# Patient Record
Sex: Female | Born: 1985 | Race: Black or African American | Hispanic: No | Marital: Married | Smoking: Never smoker
Health system: Southern US, Community
[De-identification: ages and names within clinical notes are randomized; demographics above are authoritative.]

## PROBLEM LIST (undated history)

## (undated) DIAGNOSIS — K8020 Calculus of gallbladder without cholecystitis without obstruction: Secondary | ICD-10-CM

## (undated) DIAGNOSIS — K859 Acute pancreatitis without necrosis or infection, unspecified: Secondary | ICD-10-CM

## (undated) DIAGNOSIS — K811 Chronic cholecystitis: Secondary | ICD-10-CM

## (undated) DIAGNOSIS — R748 Abnormal levels of other serum enzymes: Secondary | ICD-10-CM

## (undated) DIAGNOSIS — N764 Abscess of vulva: Secondary | ICD-10-CM

## (undated) DIAGNOSIS — K4090 Unilateral inguinal hernia, without obstruction or gangrene, not specified as recurrent: Secondary | ICD-10-CM

## (undated) DIAGNOSIS — A539 Syphilis, unspecified: Secondary | ICD-10-CM

## (undated) HISTORY — DX: Syphilis, unspecified: A53.9

---
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
## 2004-04-22 ENCOUNTER — Emergency Department (HOSPITAL_COMMUNITY): Admission: EM | Admit: 2004-04-22 | Discharge: 2004-04-22 | Payer: Self-pay

## 2006-12-24 ENCOUNTER — Emergency Department (HOSPITAL_COMMUNITY): Admission: EM | Admit: 2006-12-24 | Discharge: 2006-12-24 | Payer: Self-pay | Admitting: *Deleted

## 2007-07-31 ENCOUNTER — Emergency Department (HOSPITAL_COMMUNITY): Admission: EM | Admit: 2007-07-31 | Discharge: 2007-07-31 | Payer: Self-pay | Admitting: Family Medicine

## 2007-08-02 ENCOUNTER — Emergency Department (HOSPITAL_COMMUNITY): Admission: EM | Admit: 2007-08-02 | Discharge: 2007-08-02 | Payer: Self-pay | Admitting: Family Medicine

## 2007-12-20 ENCOUNTER — Emergency Department (HOSPITAL_COMMUNITY): Admission: EM | Admit: 2007-12-20 | Discharge: 2007-12-20 | Payer: Self-pay | Admitting: Family Medicine

## 2008-03-08 ENCOUNTER — Ambulatory Visit (HOSPITAL_COMMUNITY): Admission: RE | Admit: 2008-03-08 | Discharge: 2008-03-08 | Payer: Self-pay | Admitting: Obstetrics & Gynecology

## 2008-05-03 ENCOUNTER — Ambulatory Visit (HOSPITAL_COMMUNITY): Admission: RE | Admit: 2008-05-03 | Discharge: 2008-05-03 | Payer: Self-pay | Admitting: Family Medicine

## 2008-05-04 ENCOUNTER — Inpatient Hospital Stay (HOSPITAL_COMMUNITY): Admission: AD | Admit: 2008-05-04 | Discharge: 2008-05-04 | Payer: Self-pay | Admitting: Gynecology

## 2008-05-06 ENCOUNTER — Ambulatory Visit: Payer: Self-pay | Admitting: Advanced Practice Midwife

## 2008-05-06 ENCOUNTER — Inpatient Hospital Stay (HOSPITAL_COMMUNITY): Admission: AD | Admit: 2008-05-06 | Discharge: 2008-05-06 | Payer: Self-pay | Admitting: Obstetrics & Gynecology

## 2008-05-09 ENCOUNTER — Ambulatory Visit: Payer: Self-pay | Admitting: Family

## 2008-05-09 ENCOUNTER — Inpatient Hospital Stay (HOSPITAL_COMMUNITY): Admission: AD | Admit: 2008-05-09 | Discharge: 2008-05-14 | Payer: Self-pay | Admitting: Family Medicine

## 2008-05-12 ENCOUNTER — Encounter: Payer: Self-pay | Admitting: Physician Assistant

## 2009-03-15 ENCOUNTER — Emergency Department (HOSPITAL_COMMUNITY): Admission: EM | Admit: 2009-03-15 | Discharge: 2009-03-15 | Payer: Self-pay | Admitting: Family Medicine

## 2009-08-18 IMAGING — US US OB FOLLOW-UP
1 series · 14 of 28 positions shown · non-contrast
Comparison: none

OBSTETRICAL ULTRASOUND:
 This ultrasound exam was performed in the [HOSPITAL] Ultrasound Department.  The OB US report was generated in the AS system, and faxed to the ordering physician.  This report is also available in [REDACTED] PACS.

[Series 1: us ob follow-up · 0.24mm/px · 14 of 29 slices shown]
[im 2/29]
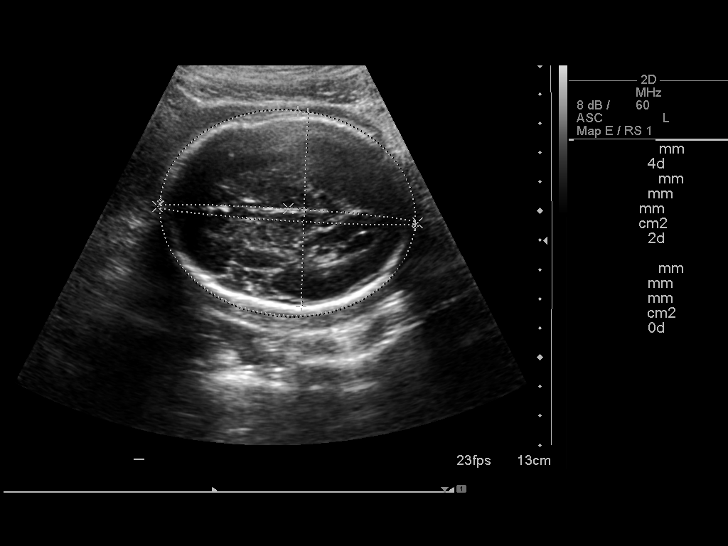
[im 4/29]
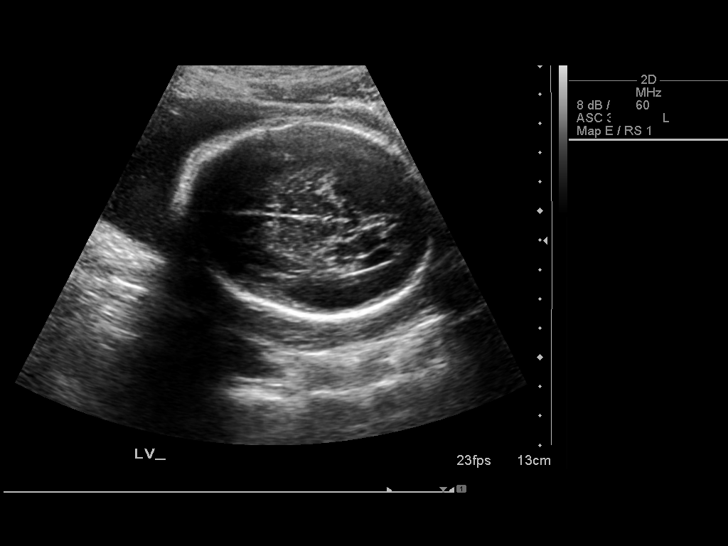
[im 6/29]
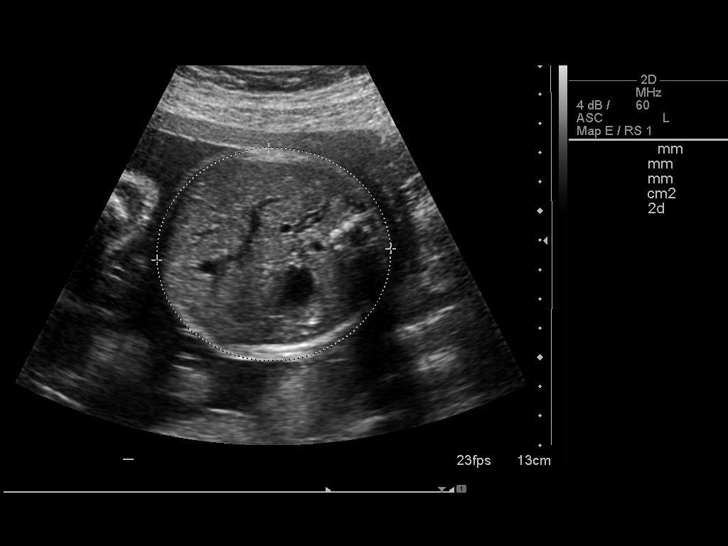
[im 8/29]
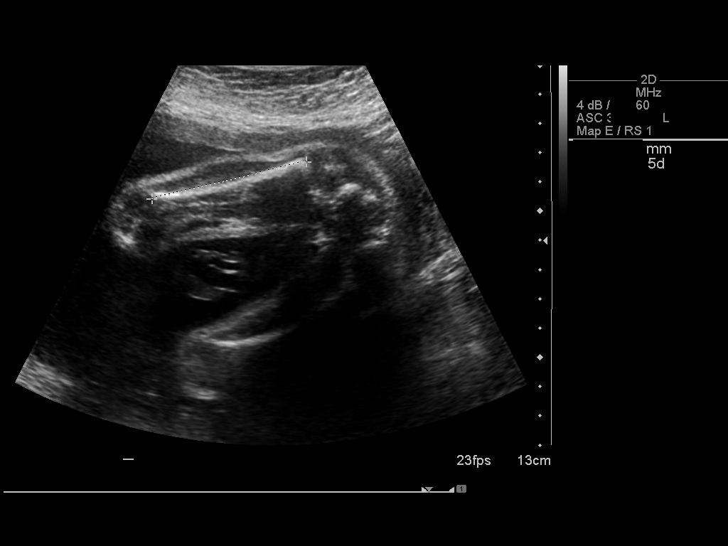
[im 10/29]
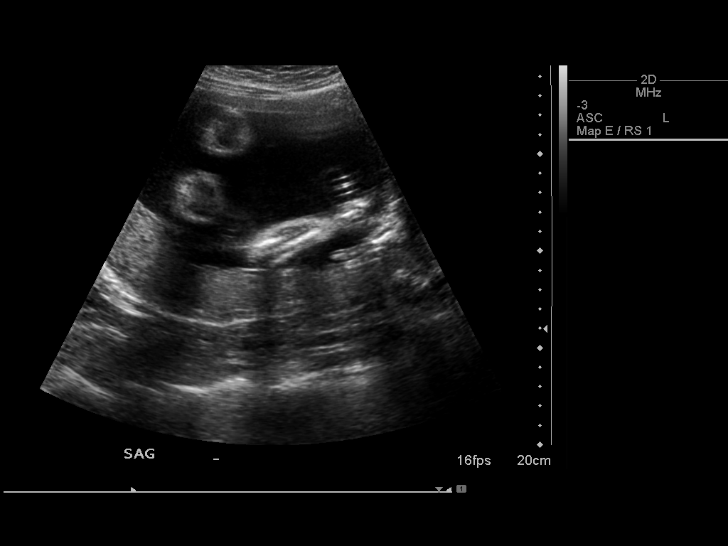
[im 12/29]
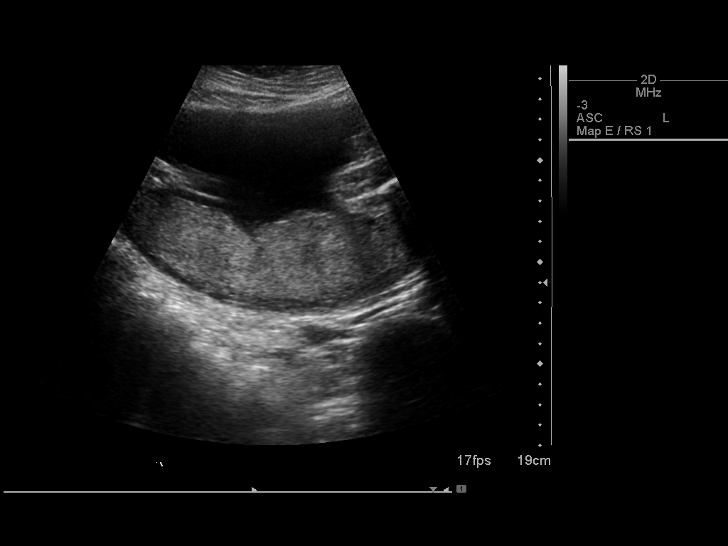
[im 14/29]
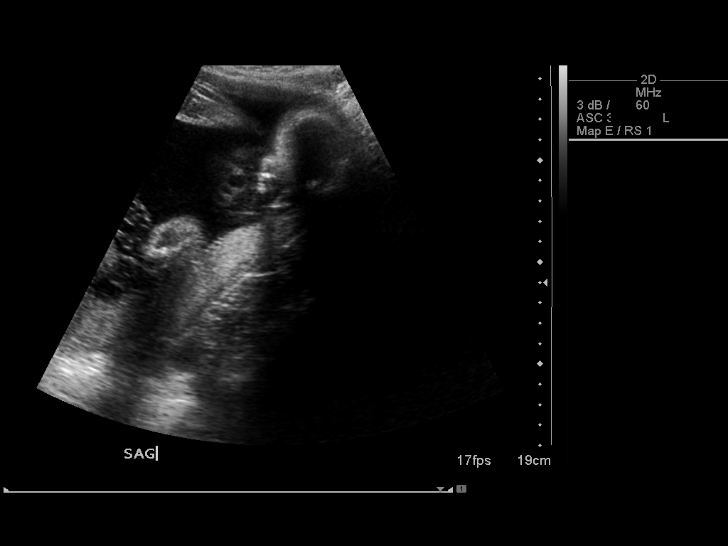
[im 16/29]
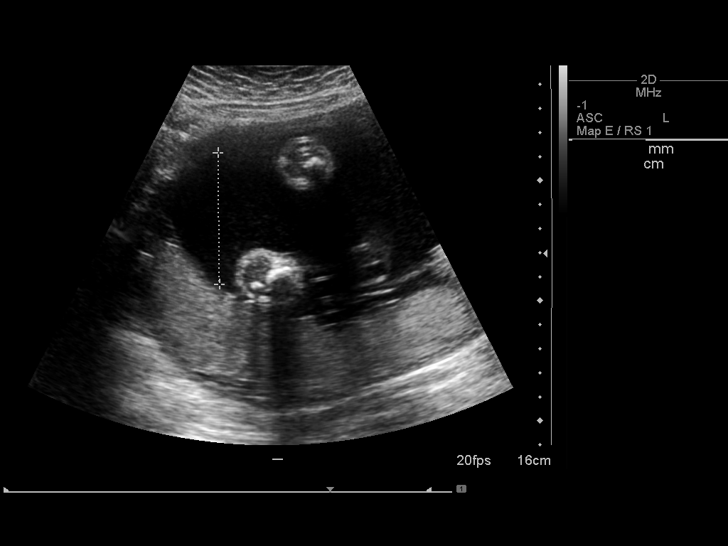
[im 18/29]
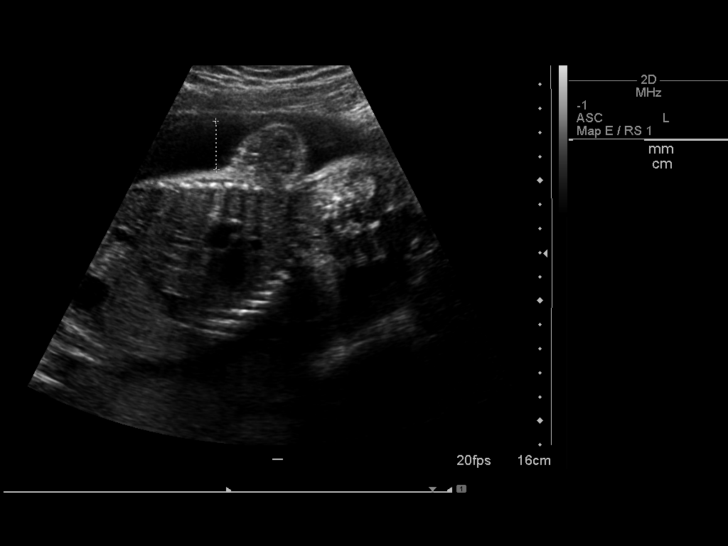
[im 20/29]
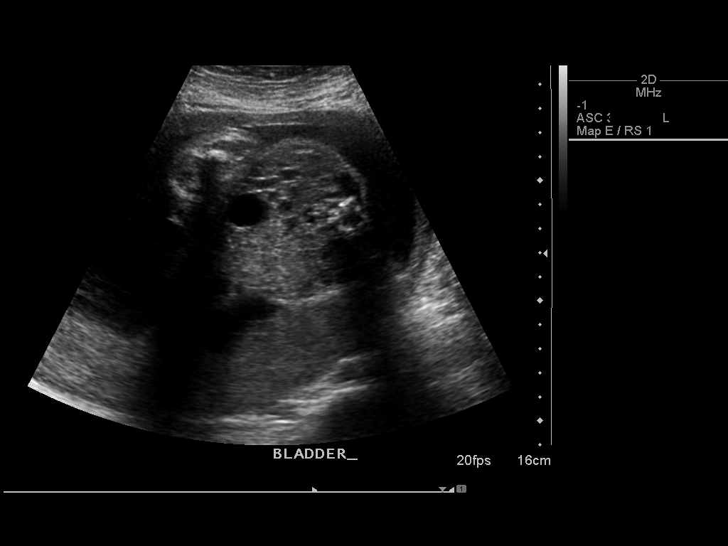
[im 22/29]
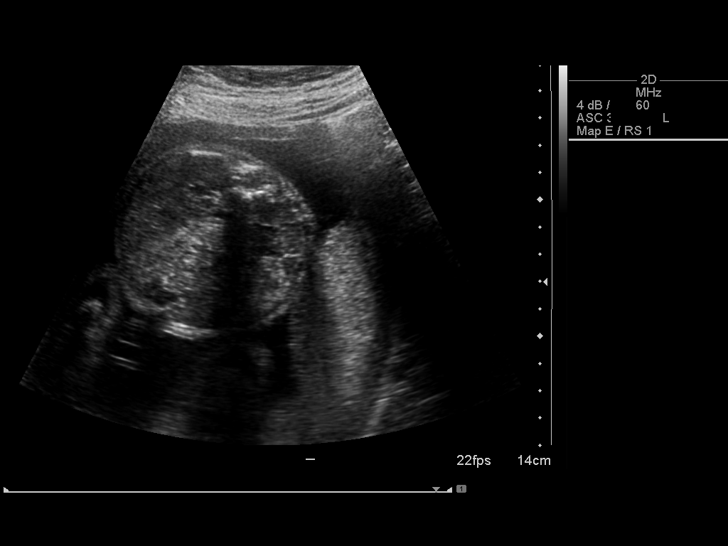
[im 24/29]
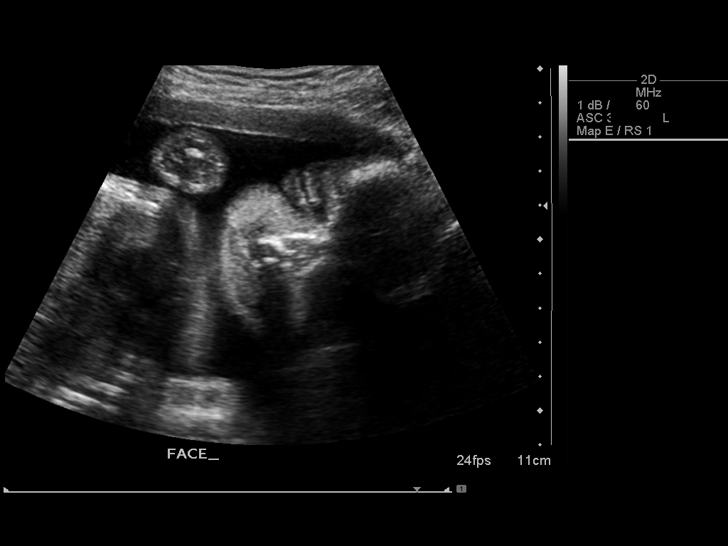
[im 26/29]
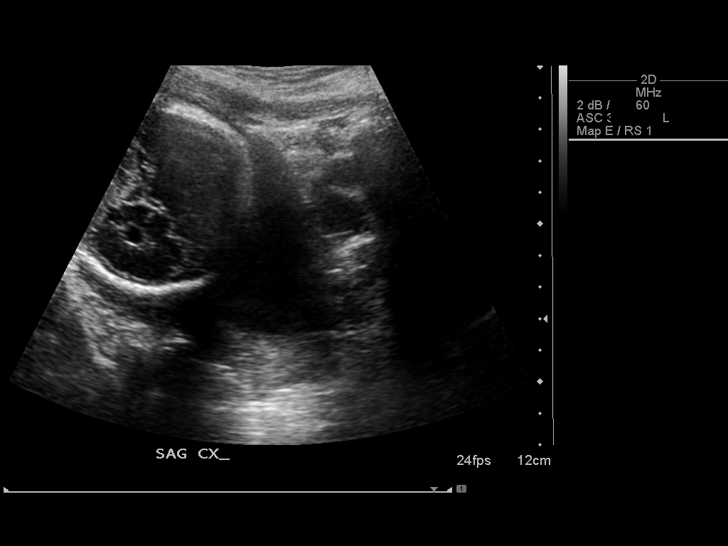
[im 29/29]
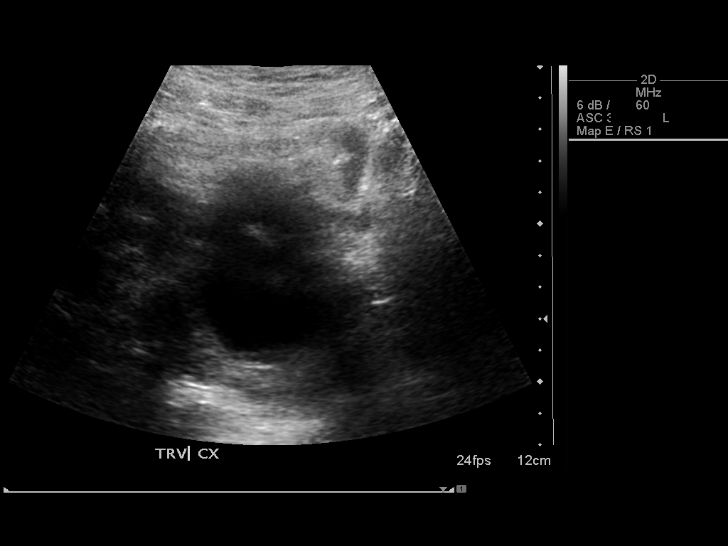

[14 of 28 positions shown; findings below may reference images not displayed]

IMPRESSION: See AS Obstetric US report.

## 2011-02-20 LAB — POCT URINALYSIS DIP (DEVICE)
Glucose, UA: NEGATIVE mg/dL
Ketones, ur: 40 mg/dL — AB
Nitrite: NEGATIVE
Specific Gravity, Urine: 1.025 (ref 1.005–1.030)
pH: 8 (ref 5.0–8.0)

## 2011-02-20 LAB — POCT PREGNANCY, URINE: Preg Test, Ur: NEGATIVE

## 2011-06-07 ENCOUNTER — Inpatient Hospital Stay (INDEPENDENT_AMBULATORY_CARE_PROVIDER_SITE_OTHER)
Admission: RE | Admit: 2011-06-07 | Discharge: 2011-06-07 | Disposition: A | Discharge status: Home / Self Care | Payer: Self-pay | Source: Ambulatory Visit | Attending: Family Medicine | Admitting: Family Medicine

## 2011-06-07 DIAGNOSIS — M62838 Other muscle spasm: Secondary | ICD-10-CM

## 2011-06-07 DIAGNOSIS — M549 Dorsalgia, unspecified: Secondary | ICD-10-CM

## 2011-08-09 LAB — GC/CHLAMYDIA PROBE AMP, GENITAL
Chlamydia, DNA Probe: NEGATIVE
GC Probe Amp, Genital: NEGATIVE

## 2011-08-09 LAB — URINALYSIS, ROUTINE W REFLEX MICROSCOPIC
Glucose, UA: NEGATIVE
Ketones, ur: >80 — AB
Nitrite: NEGATIVE
Protein, ur: NEGATIVE
pH: 6

## 2011-08-09 LAB — CBC
HCT: 38.7
HCT: 36
MCHC: 34.4
MCHC: 34.3
MCV: 94.9
Platelets: 192
RBC: 4.07
RBC: 3.82 — ABNORMAL LOW
RDW: 13.2
RDW: 13.1
WBC: 9.5

## 2011-08-09 LAB — WET PREP, GENITAL
Clue Cells Wet Prep HPF POC: NONE SEEN
Clue Cells Wet Prep HPF POC: NONE SEEN
Trich, Wet Prep: NONE SEEN

## 2011-08-09 LAB — URINE MICROSCOPIC-ADD ON

## 2011-12-03 ENCOUNTER — Emergency Department (INDEPENDENT_AMBULATORY_CARE_PROVIDER_SITE_OTHER)
Admission: EM | Admit: 2011-12-03 | Discharge: 2011-12-03 | Disposition: A | Discharge status: Home / Self Care | Payer: Self-pay | Source: Home / Self Care | Attending: Family Medicine | Admitting: Family Medicine

## 2011-12-03 ENCOUNTER — Encounter (HOSPITAL_COMMUNITY): Payer: Self-pay

## 2011-12-03 DIAGNOSIS — J020 Streptococcal pharyngitis: Secondary | ICD-10-CM

## 2011-12-03 LAB — POCT RAPID STREP A: Streptococcus, Group A Screen (Direct): NEGATIVE

## 2011-12-03 MED ORDER — AMOXICILLIN 500 MG PO CAPS: 500.0000 mg | ORAL_CAPSULE | Freq: Three times a day (TID) | ORAL | Status: AC

## 2011-12-03 NOTE — ED Provider Notes (Signed)
History     CSN: [id number]  Arrival date & time 12/03/11  1731   First MD Initiated Contact with Patient 12/03/11 1747      Chief Complaint  Patient presents with  . Sore Throat    (Consider location/radiation/quality/duration/timing/severity/associated sxs/prior treatment) Patient is a 26 y.o. female presenting with pharyngitis. The history is provided by the patient.  Sore Throat This is a new problem. The current episode started more than 2 days ago. The problem occurs constantly. The problem has been gradually worsening. Pertinent negatives include no chest pain and no abdominal pain. The symptoms are aggravated by swallowing.    History reviewed. No pertinent past medical history.  History reviewed. No pertinent past surgical history.  History reviewed. No pertinent family history.  History  Substance Use Topics  . Smoking status: Not on file  . Smokeless tobacco: Not on file  . Alcohol Use: Not on file    OB History    Grav Para Term Preterm Abortions TAB SAB Ect Mult Living                  Review of Systems  Constitutional: Negative.   HENT: Positive for sore throat. Negative for congestion, rhinorrhea and postnasal drip.   Respiratory: Negative for cough.   Cardiovascular: Negative for chest pain.  Gastrointestinal: Negative.  Negative for abdominal pain.  Skin: Negative for rash.    Allergies  Review of patient's allergies indicates no known allergies.  Home Medications   Current Outpatient Rx  Name Route Sig Dispense Refill  . AMOXICILLIN 500 MG PO CAPS Oral Take 1 capsule (500 mg total) by mouth 3 (three) times daily. 30 capsule 0    BP 134/84  Pulse 75  Temp(Src) 99.6 F (37.6 C) (Oral)  SpO2 99%  Physical Exam  Nursing note and vitals reviewed. Constitutional: She appears well-developed and well-nourished.  HENT:  Head: Normocephalic.  Right Ear: External ear normal.  Left Ear: External ear normal.  Mouth/Throat: Oropharyngeal  exudate and posterior oropharyngeal erythema present.    ED Course  Procedures (including critical care time)   Labs Reviewed  POCT RAPID STREP A ([facility] ONLY)   No results found.   1. Strep throat       MDM  Strep --neg.        Barkley Bruns, MD 12/03/11 [phone]

## 2011-12-03 NOTE — ED Notes (Signed)
C/o 3 day duration of ST; NAD, minimal relief w home treatment

## 2013-04-05 ENCOUNTER — Emergency Department (HOSPITAL_COMMUNITY): Payer: BC Managed Care – PPO

## 2013-04-05 ENCOUNTER — Emergency Department (INDEPENDENT_AMBULATORY_CARE_PROVIDER_SITE_OTHER)
Admission: EM | Admit: 2013-04-05 | Discharge: 2013-04-05 | Disposition: A | Discharge status: Nursing Home (Includes ICF) | Payer: BC Managed Care – PPO | Source: Home / Self Care | Attending: Family Medicine | Admitting: Family Medicine

## 2013-04-05 ENCOUNTER — Emergency Department (HOSPITAL_COMMUNITY)
Admission: EM | Admit: 2013-04-05 | Discharge: 2013-04-05 | Disposition: A | Discharge status: Home / Self Care | Payer: BC Managed Care – PPO | Attending: Emergency Medicine | Admitting: Emergency Medicine

## 2013-04-05 ENCOUNTER — Encounter (HOSPITAL_COMMUNITY): Payer: Self-pay | Admitting: Emergency Medicine

## 2013-04-05 ENCOUNTER — Encounter (HOSPITAL_COMMUNITY): Payer: Self-pay | Admitting: Nurse Practitioner

## 2013-04-05 DIAGNOSIS — R748 Abnormal levels of other serum enzymes: Secondary | ICD-10-CM | POA: Insufficient documentation

## 2013-04-05 DIAGNOSIS — K8020 Calculus of gallbladder without cholecystitis without obstruction: Secondary | ICD-10-CM

## 2013-04-05 DIAGNOSIS — K859 Acute pancreatitis without necrosis or infection, unspecified: Secondary | ICD-10-CM | POA: Insufficient documentation

## 2013-04-05 DIAGNOSIS — R1011 Right upper quadrant pain: Secondary | ICD-10-CM

## 2013-04-05 DIAGNOSIS — K851 Biliary acute pancreatitis without necrosis or infection: Secondary | ICD-10-CM

## 2013-04-05 DIAGNOSIS — K8010 Calculus of gallbladder with chronic cholecystitis without obstruction: Secondary | ICD-10-CM | POA: Insufficient documentation

## 2013-04-05 LAB — COMPREHENSIVE METABOLIC PANEL
BUN: 6 mg/dL (ref 6–23)
Calcium: 9.4 mg/dL (ref 8.4–10.5)
GFR calc Af Amer: >90 mL/min (ref >90)
Glucose, Bld: 87 mg/dL (ref 70–99)
Sodium: 139 mEq/L (ref 135–145)
Total Protein: 7.7 g/dL (ref 6.0–8.3)

## 2013-04-05 LAB — CBC WITH DIFFERENTIAL/PLATELET
Basophils Relative: 0 % (ref 0–1)
Eosinophils Absolute: 0 10*3/uL (ref 0.0–0.7)
Eosinophils Relative: 0 % (ref 0–5)
Lymphs Abs: 1.7 10*3/uL (ref 0.7–4.0)
MCH: 30.8 pg (ref 26.0–34.0)
MCHC: 34.3 g/dL (ref 30.0–36.0)
MCV: 89.9 fL (ref 78.0–100.0)
Monocytes Relative: 6 % (ref 3–12)
Platelets: 216 10*3/uL (ref 150–400)
RBC: 4.74 MIL/uL (ref 3.87–5.11)

## 2013-04-05 LAB — POCT URINALYSIS DIP (DEVICE)
Glucose, UA: NEGATIVE mg/dL
Leukocytes, UA: NEGATIVE
Urobilinogen, UA: 4 mg/dL — ABNORMAL HIGH (ref 0.0–1.0)
pH: 5.5 (ref 5.0–8.0)

## 2013-04-05 LAB — POCT PREGNANCY, URINE: Preg Test, Ur: NEGATIVE

## 2013-04-05 LAB — LIPASE, BLOOD: Lipase: 214 U/L — ABNORMAL HIGH (ref 11–59)

## 2013-04-05 MED ORDER — ONDANSETRON 4 MG PO TBDP: 4.0000 mg | ORAL_TABLET | Freq: Three times a day (TID) | ORAL | Status: DC | PRN

## 2013-04-05 MED ORDER — OXYCODONE HCL 5 MG PO TABS: 5.0000 mg | ORAL_TABLET | ORAL | Status: DC | PRN

## 2013-04-05 MED ORDER — ONDANSETRON 4 MG PO TBDP
4.0000 mg | ORAL_TABLET | Freq: Once | ORAL | Status: AC
  Administered 2013-04-05: 4 mg via ORAL
  Filled 2013-04-05: qty 1

## 2013-04-05 NOTE — ED Provider Notes (Signed)
History     CSN: [id number]  Arrival date & time 04/05/13  1303   First MD Initiated Contact with Patient 04/05/13 1323      Chief Complaint  Patient presents with  . Abdominal Pain    (Consider location/radiation/quality/duration/timing/severity/associated sxs/prior treatment) HPI Comments: Pt presents to the ED from UC for further evaluation of RUQ/epigastric abdominal pain.  Pt reports Friday evening after eating spaghetti she began to fill extremely nauseous with sharp stabbing abdominal pains. She made herself throw up, after which she felt fine.  Patient reports same thing events occurred Saturday evening after eating baked chicken. Friend who is with her today ate the same foods both nights and did not have any of the same sx.  Pt is currently asx- notes sx only seem to happen late at night after eating dinner, never during the day. Was seen by urgent care provider earlier today, sent here for evaluation of possible gallstones.  Denies any recent intake of fatty greasy foods. No hematemesis, diarrhea, hematochezia, dysuria, hematuria, or increased urinary frequency.  Pt currently has mirena IUD.  No recent EtOH.  No chest pain or SOB.  The history is provided by the patient.    History reviewed. No pertinent past medical history.  History reviewed. No pertinent past surgical history.  History reviewed. No pertinent family history.  History  Substance Use Topics  . Smoking status: Never Smoker   . Smokeless tobacco: Not on file  . Alcohol Use: No    OB History   Grav Para Term Preterm Abortions TAB SAB Ect Mult Living                  Review of Systems  Gastrointestinal: Positive for nausea, vomiting and abdominal pain.  All other systems reviewed and are negative.    Allergies  Review of patient's allergies indicates no known allergies.  Home Medications   Current Outpatient Rx  Name  Route  Sig  Dispense  Refill  . acetaminophen (TYLENOL) 500 MG tablet  Oral   Take 1,000 mg by mouth daily as needed for pain. For pain           BP 136/78  Pulse 63  Temp(Src) 98.8 F (37.1 C) (Oral)  Resp 16  SpO2 99%  LMP 03/14/2013  Physical Exam  Nursing note and vitals reviewed. Constitutional: She is oriented to person, place, and time. She appears well-developed and well-nourished.  HENT:  Head: Normocephalic and atraumatic.  Mouth/Throat: Oropharynx is clear and moist.  Eyes: Conjunctivae and EOM are normal. Pupils are equal, round, and reactive to light.  Neck: Normal range of motion.  Cardiovascular: Normal rate, regular rhythm and normal heart sounds.   Pulmonary/Chest: Effort normal and breath sounds normal.  Abdominal: Soft. Bowel sounds are normal. There is no tenderness. There is no CVA tenderness, no tenderness at McBurney's point and negative Murphy's sign.  No TTP of RUQ or epigastrium  Musculoskeletal: Normal range of motion.  Neurological: She is alert and oriented to person, place, and time.  Skin: Skin is warm and dry.  Psychiatric: She has a normal mood and affect.    ED Course  Procedures (including critical care time)  Labs Reviewed  COMPREHENSIVE METABOLIC PANEL - Abnormal; Notable for the following:    AST 526 (*)    ALT 672 (*)    Alkaline Phosphatase 195 (*)    Total Bilirubin 2.0 (*)    All other components within normal limits  LIPASE, BLOOD -  Abnormal; Notable for the following:    Lipase 214 (*)    All other components within normal limits  CBC WITH DIFFERENTIAL   US Abdomen Complete  04/05/2013   *RADIOLOGY REPORT*  Clinical Data:  Right upper quadrant pain.  Nausea, vomiting.  COMPLETE ABDOMINAL ULTRASOUND  Comparison:  No previous abdominal exams  Findings:  Gallbladder:  The gallbladder, there are numerous stones, creating significant shadowing.  Stones are small difficult to measure discretely.  Wall thickness is normal, 2.2 mm.  No sonographic Murphy's sign.  Common bile duct:  5.6 mm.  Liver:  No  focal lesion identified.  Within normal limits in parenchymal echogenicity.  IVC:  Appears normal.  Pancreas:  No focal abnormality seen.  Spleen:  Normal in appearance, 4.1 cm.  Right Kidney:  Normal in appearance, 10.5 cm.  Left Kidney:  Normal in appearance, 10.1 cm.  Abdominal aorta:  Not aneurysmal, 1.7 cm.  IMPRESSION:  1.  Multiple gallstones without other evidence for acute cholecystitis. 2.  No other evidence for acute abnormality in abdomen.   Original Report Authenticated By: Norva Pavlov, M.D.     1. Gallstones   2. Elevated liver enzymes   3. Gallstone pancreatitis       MDM   27 year old female presenting to the ED from urgent care for further evaluation of right upper quadrant back/epigastric pain. Episodes of waxing and waning over the weekend, associated with nausea and vomiting.   Pt remained asx while in the ED.  Labs abnormal, AST/ALT markedly elevated 526/672, alk phos 195, lipase 214- consistent with gallstone pancreatitis. Patient's case was discussed with general surgery, Dr. Derrell Lolling- OP FU vs IP observation and monitoring of LFTs. Pt will likely will need a cholecystectomy, but this is not urgent. Discussed with patient, she elected for outpatient followup- contact information given.  Rx Percocet and Zofran.  Return precautions advised.       Garlon Hatchet, PA-C 04/05/13 [phone]

## 2013-04-05 NOTE — ED Notes (Signed)
Pt c/o abdominal pain that started on Friday night. Acute onset. "stabbing sensation" pt states that after vomiting felt better.  Saturday no pain. Last night pain started at 10 pm and lasted until 2 a.m, pt states that she made her self vomit but did not relieve symptoms. Symptoms gradually eased off .  States having regular BM. Pt has not tried any otc meds.

## 2013-04-05 NOTE — ED Notes (Signed)
Sent from [facility] for further evaluation of abd pain on Friday and Saturday nights with some vomiting. Denies complaints now. A&Ox4, resp e/u

## 2013-04-05 NOTE — ED Notes (Signed)
UA and Upreg complete at [facility]

## 2013-04-05 NOTE — ED Provider Notes (Signed)
History     CSN: [id number]  Arrival date & time 04/05/13  1108   First MD Initiated Contact with Patient 04/05/13 1227      Chief Complaint  Patient presents with  . Abdominal Pain    abdominal pain that started friday night. "stabbing sensation"      Patient is a 27 y.o. female presenting with abdominal pain. The history is provided by the patient.  Abdominal Pain This is a recurrent problem. The current episode started more than 2 days ago. The problem has been resolved. Associated symptoms include abdominal pain. The symptoms are aggravated by eating. Nothing relieves the symptoms. She has tried nothing for the symptoms.  Pt reports onset of abd pain Friday night that is associated with N/V. Denies fever. States she seems to do ok during the day but 2 to 3 hrs after the evening meal she starts to have severe abd pain and pressure that hurts "all over". She then get nauseated and has vomiting. Reports 3 episodes of vomiting Friday night and 2 episodes last night. After several hours the pain and vomiting seem to resolve. Pt reports she had a similar episode around Christmas that resolved spontaneously. Reports LMP 03/14/13 that was normal. Currently using the [facility] IUD. Denies diarrhea, constipation or UTI sx's.   History reviewed. No pertinent past medical history.  History reviewed. No pertinent past surgical history.  History reviewed. No pertinent family history.  History  Substance Use Topics  . Smoking status: Never Smoker   . Smokeless tobacco: Not on file  . Alcohol Use: No    OB History   Grav Para Term Preterm Abortions TAB SAB Ect Mult Living                  Review of Systems  Constitutional: Negative for fever, chills and appetite change.  HENT: Negative.   Eyes: Negative.   Respiratory: Negative.   Cardiovascular: Negative.   Gastrointestinal: Positive for nausea, vomiting and abdominal pain. Negative for diarrhea, constipation and abdominal distention.   Endocrine: Negative.   Genitourinary: Negative.   Musculoskeletal: Negative.   Skin: Negative.   Allergic/Immunologic: Negative.   Neurological: Negative.   Hematological: Negative.   Psychiatric/Behavioral: Negative.     Allergies  Review of patient's allergies indicates no known allergies.  Home Medications  No current outpatient prescriptions on file.  BP 169/82  Pulse 86  Temp(Src) 98.4 F (36.9 C) (Oral)  Resp 18  SpO2 98%  LMP 03/14/2013  Physical Exam  Constitutional: She is oriented to person, place, and time. She appears well-developed and well-nourished.  HENT:  Head: Normocephalic and atraumatic.  Eyes: Conjunctivae are normal.  Cardiovascular: Normal rate and regular rhythm.   Pulmonary/Chest: Effort normal and breath sounds normal.  Abdominal: Soft. Bowel sounds are normal. She exhibits no distension. There is tenderness in the right upper quadrant. There is positive Murphy's sign. There is no rebound.  Musculoskeletal: Normal range of motion.  Neurological: She is alert and oriented to person, place, and time.  Skin: Skin is warm and dry.  Psychiatric: She has a normal mood and affect.    ED Course  Procedures (including critical care time)  Labs Reviewed  POCT URINALYSIS DIP (DEVICE) - Abnormal; Notable for the following:    Bilirubin Urine LARGE (*)    Ketones, ur 15 (*)    Hgb urine dipstick SMALL (*)    Urobilinogen, UA 4.0 (*)    All other components within normal limits  POCT  PREGNANCY, URINE   No results found.   No diagnosis found.    MDM  HPI and PE c/w biliary colic (probable gallstones). + Murphy's sign. Recurring episodes of severe abd pain, N/V. No fever. Urine shows mild dehydration and large bilirubin. Will send to [facility] ED for further work-up to r/o gallstones.        Leanne Chang, NP 04/05/13 1251

## 2013-04-06 NOTE — ED Provider Notes (Signed)
Medical screening examination/treatment/procedure(s) were performed by non-physician practitioner and as supervising physician I was immediately available for consultation/collaboration.   MORENO-COLL,Raghad Lorenz; MD  Markesha Hannig Moreno-Coll, MD 04/06/13 1005 

## 2013-04-09 NOTE — ED Provider Notes (Signed)
Medical screening examination/treatment/procedure(s) were performed by non-physician practitioner and as supervising physician I was immediately available for consultation/collaboration.   Nelia Shi, MD 04/09/13 [phone]

## 2017-06-20 ENCOUNTER — Encounter (HOSPITAL_COMMUNITY): Payer: Self-pay | Admitting: Emergency Medicine

## 2017-06-20 DIAGNOSIS — K8020 Calculus of gallbladder without cholecystitis without obstruction: Secondary | ICD-10-CM | POA: Diagnosis not present

## 2017-06-20 DIAGNOSIS — E876 Hypokalemia: Secondary | ICD-10-CM | POA: Diagnosis not present

## 2017-06-20 DIAGNOSIS — R1011 Right upper quadrant pain: Secondary | ICD-10-CM | POA: Insufficient documentation

## 2017-06-20 DIAGNOSIS — R1110 Vomiting, unspecified: Secondary | ICD-10-CM | POA: Diagnosis present

## 2017-06-20 LAB — CBC
HEMATOCRIT: 44.2 % (ref 36.0–46.0)
HEMOGLOBIN: 15.4 g/dL — AB (ref 12.0–15.0)
MCH: 32 pg (ref 26.0–34.0)
MCHC: 34.8 g/dL (ref 30.0–36.0)
MCV: 91.7 fL (ref 78.0–100.0)
Platelets: 244 10*3/uL (ref 150–400)
RBC: 4.82 MIL/uL (ref 3.87–5.11)
RDW: 12.5 % (ref 11.5–15.5)
WBC: 6.8 10*3/uL (ref 4.0–10.5)

## 2017-06-20 LAB — URINALYSIS, ROUTINE W REFLEX MICROSCOPIC
BILIRUBIN URINE: NEGATIVE
Glucose, UA: NEGATIVE mg/dL
KETONES UR: NEGATIVE mg/dL
Leukocytes, UA: NEGATIVE
NITRITE: NEGATIVE
Protein, ur: NEGATIVE mg/dL
Specific Gravity, Urine: 1.012 (ref 1.005–1.030)
pH: 6 (ref 5.0–8.0)

## 2017-06-20 LAB — COMPREHENSIVE METABOLIC PANEL
ALBUMIN: 4.2 g/dL (ref 3.5–5.0)
ALT: 47 U/L (ref 14–54)
AST: 80 U/L — ABNORMAL HIGH (ref 15–41)
Alkaline Phosphatase: 49 U/L (ref 38–126)
Anion gap: 10 (ref 5–15)
BUN: 12 mg/dL (ref 6–20)
CALCIUM: 9.1 mg/dL (ref 8.9–10.3)
CO2: 26 mmol/L (ref 22–32)
CREATININE: 0.98 mg/dL (ref 0.44–1.00)
Chloride: 100 mmol/L — ABNORMAL LOW (ref 101–111)
GFR calc non Af Amer: >60 mL/min (ref >60)
Glucose, Bld: 113 mg/dL — ABNORMAL HIGH (ref 65–99)
Potassium: 3.1 mmol/L — ABNORMAL LOW (ref 3.5–5.1)
SODIUM: 136 mmol/L (ref 135–145)
TOTAL PROTEIN: 7.4 g/dL (ref 6.5–8.1)
Total Bilirubin: 1.1 mg/dL (ref 0.3–1.2)

## 2017-06-20 LAB — I-STAT BETA HCG BLOOD, ED (MC, WL, AP ONLY)

## 2017-06-20 LAB — LIPASE, BLOOD: LIPASE: 73 U/L — AB (ref 11–51)

## 2017-06-20 NOTE — ED Triage Notes (Signed)
Pt c/o epigastric pain with nausea and vomiting x's 3 days.  St's she has had gallbladder problems in the past

## 2017-06-21 ENCOUNTER — Emergency Department (HOSPITAL_COMMUNITY): Payer: BC Managed Care – PPO

## 2017-06-21 ENCOUNTER — Emergency Department (HOSPITAL_COMMUNITY)
Admission: EM | Admit: 2017-06-21 | Discharge: 2017-06-21 | Disposition: A | Discharge status: Home / Self Care | Payer: BC Managed Care – PPO | Attending: Emergency Medicine | Admitting: Emergency Medicine

## 2017-06-21 DIAGNOSIS — E876 Hypokalemia: Secondary | ICD-10-CM

## 2017-06-21 DIAGNOSIS — K8020 Calculus of gallbladder without cholecystitis without obstruction: Secondary | ICD-10-CM

## 2017-06-21 DIAGNOSIS — R1011 Right upper quadrant pain: Secondary | ICD-10-CM

## 2017-06-21 MED ORDER — OXYCODONE-ACETAMINOPHEN 5-325 MG PO TABS: 1.0000 | ORAL_TABLET | Freq: Four times a day (QID) | ORAL | 0 refills | Status: DC | PRN

## 2017-06-21 MED ORDER — ONDANSETRON HCL 4 MG/2ML IJ SOLN
4.0000 mg | Freq: Once | INTRAMUSCULAR | Status: AC
  Administered 2017-06-21: 4 mg via INTRAVENOUS
  Filled 2017-06-21: qty 2

## 2017-06-21 MED ORDER — SODIUM CHLORIDE 0.9 % IV BOLUS (SEPSIS)
1000.0000 mL | Freq: Once | INTRAVENOUS | Status: AC
  Administered 2017-06-21: 1000 mL via INTRAVENOUS

## 2017-06-21 MED ORDER — POTASSIUM CHLORIDE CRYS ER 20 MEQ PO TBCR
40.0000 meq | EXTENDED_RELEASE_TABLET | Freq: Once | ORAL | Status: AC
  Administered 2017-06-21: 40 meq via ORAL
  Filled 2017-06-21: qty 2

## 2017-06-21 MED ORDER — MORPHINE SULFATE (PF) 4 MG/ML IV SOLN
4.0000 mg | Freq: Once | INTRAVENOUS | Status: AC
  Administered 2017-06-21: 4 mg via INTRAVENOUS
  Filled 2017-06-21: qty 1

## 2017-06-21 MED ORDER — ONDANSETRON 4 MG PO TBDP: 4.0000 mg | ORAL_TABLET | Freq: Three times a day (TID) | ORAL | 0 refills | Status: DC | PRN

## 2017-06-21 NOTE — ED Provider Notes (Signed)
[facility]-EMERGENCY DEPT Provider Note   CSN: [id number] Arrival date & time: 06/20/17  1754     History   Chief Complaint Chief Complaint  Patient presents with  . Emesis  . Abdominal Pain    HPI Olivia Gordon is a 31 y.o. female.  HPI  31 y.o. female, presents to the Emergency Department today due to epigastric pain x 3-4 days. Noted hx same. Went to UC and told this was GI bug and given antiemetics. Notes minimal relief. States pain 10/10 and epigastric/RUQ. States it feels like a pressure sensation and is constant. Worse with PO. Pt notes hx same several years ago with gallbladder issues. Notes emesis with PO intake. No fevers. No cough/congestion. No CP/SOB. No meds PTA. No other symptoms noted.    History reviewed. No pertinent past medical history.  There are no active problems to display for this patient.   History reviewed. No pertinent surgical history.  OB History    No data available       Home Medications    Prior to Admission medications   Medication Sig Start Date End Date Taking? Authorizing Provider  acetaminophen (TYLENOL) 500 MG tablet Take 1,000 mg by mouth daily as needed for pain. For pain    [provider]  ondansetron (ZOFRAN ODT) 4 MG disintegrating tablet Take 1 tablet (4 mg total) by mouth every 8 (eight) hours as needed for nausea. 04/05/13   Garlon HatchetSanders, Lisa M, PA-C  oxyCODONE (ROXICODONE) 5 MG immediate release tablet Take 1 tablet (5 mg total) by mouth every 4 (four) hours as needed for pain. 04/05/13   Garlon HatchetSanders, Lisa M, PA-C    Family History No family history on file.  Social History Social History  Substance Use Topics  . Smoking status: Never Smoker  . Smokeless tobacco: Never Used  . Alcohol use No     Allergies   Patient has no known allergies.   Review of Systems Review of Systems ROS reviewed and all are negative for acute change except as noted in the HPI.  Physical Exam Updated Vital Signs BP (!) 146/87    Pulse (!) 54   Temp 98.7 F (37.1 C) (Oral)   Resp 16   Ht 5\' 5"  (1.651 m)   Wt 72.1 kg (159 lb)   SpO2 100%   BMI 26.46 kg/m   Physical Exam  Constitutional: Olivia Gordon is oriented to person, place, and time. Vital signs are normal. Olivia Gordon appears well-developed and well-nourished.  HENT:  Head: Normocephalic and atraumatic.  Right Ear: Hearing normal.  Left Ear: Hearing normal.  Eyes: Pupils are equal, round, and reactive to light. Conjunctivae and EOM are normal.  Neck: Normal range of motion.  Cardiovascular: Normal rate, regular rhythm, normal heart sounds and intact distal pulses.   Pulmonary/Chest: Effort normal and breath sounds normal.  Abdominal: Soft. Normal appearance and bowel sounds are normal. There is tenderness in the right upper quadrant and epigastric area. There is positive Murphy's sign. There is no rigidity, no rebound, no guarding and no CVA tenderness.  Musculoskeletal: Normal range of motion.  Neurological: Olivia Gordon is alert and oriented to person, place, and time.  Skin: Skin is warm and dry.  Psychiatric: Olivia Gordon has a normal mood and affect. Her speech is normal and behavior is normal. Thought content normal.  Nursing note and vitals reviewed.  ED Treatments / Results  Labs (all labs ordered are listed, but only abnormal results are displayed) Labs Reviewed  LIPASE, BLOOD - Abnormal;  Notable for the following:       Result Value   Lipase 73 (*)    All other components within normal limits  COMPREHENSIVE METABOLIC PANEL - Abnormal; Notable for the following:    Potassium 3.1 (*)    Chloride 100 (*)    Glucose, Bld 113 (*)    AST 80 (*)    All other components within normal limits  CBC - Abnormal; Notable for the following:    Hemoglobin 15.4 (*)    All other components within normal limits  URINALYSIS, ROUTINE W REFLEX MICROSCOPIC - Abnormal; Notable for the following:    Hgb urine dipstick SMALL (*)    Bacteria, UA RARE (*)    Squamous Epithelial / LPF 0-5 (*)     All other components within normal limits  I-STAT BETA HCG BLOOD, ED ([facility], [facility], [facility] ONLY)    EKG  EKG Interpretation None       Radiology US Abdomen Limited Ruq  Result Date: 06/21/2017 CLINICAL DATA:  Right upper quadrant pain, nausea and vomiting x3 days. EXAM: ULTRASOUND ABDOMEN LIMITED RIGHT UPPER QUADRANT COMPARISON:  04/05/2013 FINDINGS: Gallbladder: Numerous gallstones are noted within the gallbladder causing a wall-echo-shadow complex upon sonographic imaging. The gallbladder wall however is not thickened at 2 mm. There is no pericholecystic fluid. No biliary dilatation is identified. Common bile duct: Diameter: Normal at 2.9 mm Liver: No focal lesion identified. Within normal limits in parenchymal echogenicity. No biliary dilatation. Hepatopetal flow on color Doppler imaging within the main portal vein. IMPRESSION: Uncomplicated cholelithiasis. Electronically Signed   By: Tollie Eth M.D.   On: 06/21/2017 01:55    Procedures Procedures (including critical care time)  Medications Ordered in ED Medications  potassium chloride SA (K-DUR,KLOR-CON) CR tablet 40 mEq (not administered)  morphine 4 MG/ML injection 4 mg (4 mg Intravenous Given 06/21/17 0107)  sodium chloride 0.9 % bolus 1,000 mL (1,000 mLs Intravenous New Bag/Given 06/21/17 0107)  ondansetron (ZOFRAN) injection 4 mg (4 mg Intravenous Given 06/21/17 0107)     Initial Impression / Assessment and Plan / ED Course  I have reviewed the triage vital signs and the nursing notes.  Pertinent labs & imaging results that were available during my care of the patient were reviewed by me and considered in my medical decision making (see chart for details).  Final Clinical Impressions(s) / ED Diagnoses  {I have reviewed and evaluated the relevant laboratory values. {I have reviewed and evaluated the relevant imaging studies.  {I have reviewed the relevant previous healthcare records.  {I obtained HPI from historian.   ED  Course:  Assessment: Pt is a 31 y.o. female presents to the Emergency Department today due to epigastric pain x 3-4 days. Noted hx same. Went to UC and told this was GI bug and given antiemetics. Notes minimal relief. States pain 10/10 and epigastric/RUQ. States it feels like a pressure sensation and is constant. Worse with PO. Pt notes hx same several years ago with gallbladder issues. Notes emesis with PO intake. No fevers. No cough/congestion. No CP/SOB. No meds PTA.  On exam, pt in NAD. Nontoxic/nonseptic appearing. VSS. Afebrile. Lungs CTA. Heart RRR. Abdomen TTP RUQ. Soft. Labs unremarkable. Mild hypokalemia likely 2/2 emesis. RUQ US showed cholelithiasis. No cholecystitis. Given fluids, analgesia and potassium in ED. Plan is to DC home with follow up to General Surgery. Given Rx Percocet. I have reviewed the [place] Controlled Substance Reporting System. At time of discharge, Patient is in no acute distress.  Vital Signs are stable. Patient is able to ambulate. Patient able to tolerate PO.    Disposition/Plan:  DC Home Additional Verbal discharge instructions given and discussed with patient.  Pt Instructed to f/u with general Surgery in the next week for evaluation and treatment of symptoms. Return precautions given Pt acknowledges and agrees with plan  Supervising Physician Azalia Bilis, MD  Final diagnoses:  RUQ pain  Calculus of gallbladder without cholecystitis without obstruction  Hypokalemia    New Prescriptions New Prescriptions   No medications on file     Wilber Bihari 06/21/17 Nyra Jabs, MD 06/21/17 [phone]

## 2017-06-21 NOTE — ED Notes (Signed)
Pt unerstood dc material. NAD noted. Scripts given at [facility]

## 2017-06-21 NOTE — Discharge Instructions (Signed)
Please read and follow all provided instructions.  Your diagnoses today include:  1. Calculus of gallbladder without cholecystitis without obstruction   2. RUQ pain   3. Hypokalemia     Tests performed today include: Vital signs. See below for your results today.   Medications prescribed:  Take as prescribed   Home care instructions:  Follow any educational materials contained in this packet.  Follow-up instructions: Please follow-up with General Surgery for further evaluation of symptoms and treatment   Return instructions:  Please return to the Emergency Department if you do not get better, if you get worse, or new symptoms OR  - Fever (temperature greater than 101.73F)  - Bleeding that does not stop with holding pressure to the area    -Severe pain (please note that you may be more sore the day after your accident)  - Chest Pain  - Difficulty breathing  - Severe nausea or vomiting  - Inability to tolerate food and liquids  - Passing out  - Skin becoming red around your wounds  - Change in mental status (confusion or lethargy)  - New numbness or weakness    Please return if you have any other emergent concerns.  Additional Information:  Your vital signs today were: BP (!) 146/87    Pulse (!) 54    Temp 98.7 F (37.1 C) (Oral)    Resp 16    Ht 5\' 5"  (1.651 m)    Wt 72.1 kg (159 lb)    SpO2 100%    BMI 26.46 kg/m  If your blood pressure (BP) was elevated above 135/85 this visit, please have this repeated by your doctor within one month. --------------

## 2017-07-02 ENCOUNTER — Ambulatory Visit: Payer: Self-pay | Admitting: Surgery

## 2017-07-02 NOTE — H&P (Signed)
General Surgery [facility]  Miles Costain DOB: 1986-06-23 Single / Language: Olivia Gordon / Race: Black or African American Female   History of Present Illness The patient is a 31 year old female who presents for evaluation of gall stones.  CC: symptomatic cholelithiasis, hx of pancreatitis  Patient is referred from the emergency department and by her gynecologist, Dr. Normand Sloop, for evaluation of symptomatic cholelithiasis and history of biliary pancreatitis. Patient has had a history of intermittent abdominal pain consistent with biliary colic dating back to 2014. At that time she had an episode of biliary pancreatitis which was quite significant with elevated pancreatic enzymes and elevated liver function tests. Patient did not undergo cholecystectomy due to insurance concerns. 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 She had an episode one week ago which required evaluation at the emergency department. She had right upper quadrant abdominal pain. She underwent laboratory studies showing an elevated AST of 80 and an elevated lipase of 73. Abdominal ultrasound showed multiple gallstones. Symptoms have now resolved. Patient presents for evaluation for cholecystectomy. There is no family history of gallbladder disease. Patient has had no prior abdominal surgeries.   Allergies No Known Allergies 06/27/2017  Medication History Oxycodone-Acetaminophen (5-325MG  Tablet, Oral) Active. Ondansetron (4MG  Tablet Disint, Oral) Active. Promethazine HCl (25MG  Tablet, Oral) Active. Medications Reconciled  Social History  Current tobacco use  Never smoker. No alcohol use  Caffeine use  Coffee. No drug use   Family History Diabetes Mellitus  Sister. High Blood Pressure / Hypertension  Sister. Migraine Headache  Sister.  Pregnancy / Birth History Age at menarche  10 years. Contraceptive History  Intrauterine  device. Pregnancies (Gravida)  1. Deliveries (Parity)  1. Length (months) of breastfeeding  7-12 months. Pap smear  1-5 years ago.  Vitals  Weight: 158.2 lb Height: 65in Body Surface Area: 1.79 m Body Mass Index: 26.33 kg/m  Temp.: 98.8F(Oral)  Pulse: 69 (Regular)  BP: 116/68 (Sitting, Left Arm, Standard)   Physical Exam The physical exam findings are as follows: Note:CONSTITUTIONAL See vital signs recorded above  GENERAL APPEARANCE Development: normal Nutritional status: normal Gross deformities: none  SKIN Rash, lesions, ulcers: none Induration, erythema: none Nodules: none palpable  EYES Conjunctiva and lids: normal Pupils: equal and reactive Iris: normal bilaterally  EARS, NOSE, MOUTH, THROAT External ears: no lesion or deformity External nose: no lesion or deformity Hearing: grossly normal Lips: no lesion or deformity Dentition: normal for age Oral mucosa: moist  NECK Symmetric: yes Trachea: midline Thyroid: no palpable nodules in the thyroid bed  CHEST Respiratory effort: normal Retraction or accessory muscle use: no Breath sounds: normal bilaterally Rales, rhonchi, wheeze: none  CARDIOVASCULAR Auscultation: regular rhythm, normal rate Murmurs: none Pulses: carotid and radial pulse 2+ palpable Lower extremity edema: none Lower extremity varicosities: none  ABDOMEN Distension: none Masses: none palpable Tenderness: none Hepatosplenomegaly: not present Hernia: not present  MUSCULOSKELETAL Station and gait: normal Digits and nails: no clubbing or cyanosis Muscle strength: grossly normal all extremities Range of motion: grossly normal all extremities Deformity: none  LYMPHATIC Cervical: none palpable Supraclavicular: none palpable  PSYCHIATRIC Oriented to person, place, and time: yes Mood and affect: normal for situation Judgment and insight: appropriate for situation    Assessment & Plan  BILIARY ACUTE  PANCREATITIS (K85.10) CHOLELITHIASIS WITH CHRONIC CHOLECYSTITIS (K80.10)  patient presents today for evaluation for cholecystectomy. She is accompanied by her sister. They are provided with written literature on gallbladder surgery to review at  home.  Patient has a history of biliary disease dating back at least 4 years. She has had intermittent episodes of biliary colic. She has had 2 episodes of biliary pancreatitis. She has had liver function test abnormalities. Ultrasound demonstrates multiple gallstones. Patient presents today for evaluation for cholecystectomy.  I recommended proceeding with laparoscopic cholecystectomy with intraoperative cholangiography. We discussed the risk of benefits of the procedure including the potential for conversion to open surgery. We have discussed the hospital stay to be anticipated. We have discussed her recovery and return to work and activity. She understands and wishes to proceed with surgery in the near future.  The risks and benefits of the procedure have been discussed at length with the patient. The patient understands the proposed procedure, potential alternative treatments, and the course of recovery to be expected. All of the patient's questions have been answered at this time. The patient wishes to proceed with surgery.   Velora Heckler, MD, [facility] Office: [phone]

## 2017-07-03 NOTE — Patient Instructions (Addendum)
Olivia Gordon  07/03/2017   Your procedure is scheduled on: 07-18-17   Report to [facility] Main  Entrance Take [place]  Elevators to 3rd floor to  Short Stay Center at 9:00 AM.   Call this number if you have problems the morning of surgery [phone]    Remember: ONLY 1 PERSON MAY GO WITH YOU TO SHORT STAY TO GET  READY MORNING OF YOUR SURGERY.  Do not eat food or drink liquids :After Midnight.     Take these medicines the morning of surgery with A SIP OF WATER: None                                You may not have any metal on your body including hair pins and              piercings  Do not wear jewelry, make-up, lotions, powders or perfumes, deodorant             Do not wear nail polish.  Do not shave  48 hours prior to surgery.                Do not bring valuables to the hospital. [facility] IS NOT             RESPONSIBLE   FOR VALUABLES.  Contacts, dentures or bridgework may not be worn into surgery.                  Please read over the following fact sheets you were given:             Driver on day of Discharge: Renne Musca [phone] _____________________________________________________________________             [facility] - Preparing for Surgery Before surgery, you can play an important role.  Because skin is not sterile, your skin needs to be as free of germs as possible.  You can reduce the number of germs on your skin by washing with CHG (chlorahexidine gluconate) soap before surgery.  CHG is an antiseptic cleaner which kills germs and bonds with the skin to continue killing germs even after washing. Please DO NOT use if you have an allergy to CHG or antibacterial soaps.  If your skin becomes reddened/irritated stop using the CHG and inform your nurse when you arrive at Short Stay. Do not shave (including legs and underarms) for at least 48 hours prior to the first CHG shower.  You may shave your face/neck. Please follow these  instructions carefully:  1.  Shower with CHG Soap the night before surgery and the  morning of Surgery.  2.  If you choose to wash your hair, wash your hair first as usual with your  normal  shampoo.  3.  After you shampoo, rinse your hair and body thoroughly to remove the  shampoo.                           4.  Use CHG as you would any other liquid soap.  You can apply chg directly  to the skin and wash                       Gently with a scrungie or clean washcloth.  5.  Apply the  CHG Soap to your body ONLY FROM THE NECK DOWN.   Do not use on face/ open                           Wound or open sores. Avoid contact with eyes, ears mouth and genitals (private parts).                       Wash face,  Genitals (private parts) with your normal soap.             6.  Wash thoroughly, paying special attention to the area where your surgery  will be performed.  7.  Thoroughly rinse your body with warm water from the neck down.  8.  DO NOT shower/wash with your normal soap after using and rinsing off  the CHG Soap.                9.  Pat yourself dry with a clean towel.            10.  Wear clean pajamas.            11.  Place clean sheets on your bed the night of your first shower and do not  sleep with pets. Day of Surgery : Do not apply any lotions/deodorants the morning of surgery.  Please wear clean clothes to the hospital/surgery center.  FAILURE TO FOLLOW THESE INSTRUCTIONS MAY RESULT IN THE CANCELLATION OF YOUR SURGERY PATIENT SIGNATURE_________________________________  NURSE SIGNATURE__________________________________  ________________________________________________________________________

## 2017-07-03 NOTE — Progress Notes (Signed)
07-03-17 Please provided consent order that does not have the  "IOC' abbreviation so that I can complete for patient to sign at Preadmit testing appointment. Thank you

## 2017-07-05 ENCOUNTER — Encounter (HOSPITAL_COMMUNITY)
Admission: RE | Admit: 2017-07-05 | Discharge: 2017-07-05 | Disposition: A | Discharge status: Home / Self Care | Payer: BC Managed Care – PPO | Source: Ambulatory Visit | Attending: Surgery | Admitting: Surgery

## 2017-07-05 ENCOUNTER — Encounter (HOSPITAL_COMMUNITY): Payer: Self-pay

## 2017-07-05 DIAGNOSIS — Z01818 Encounter for other preprocedural examination: Secondary | ICD-10-CM | POA: Insufficient documentation

## 2017-07-05 DIAGNOSIS — K8020 Calculus of gallbladder without cholecystitis without obstruction: Secondary | ICD-10-CM | POA: Insufficient documentation

## 2017-07-05 LAB — BASIC METABOLIC PANEL
ANION GAP: 5 (ref 5–15)
BUN: 7 mg/dL (ref 6–20)
CHLORIDE: 109 mmol/L (ref 101–111)
CO2: 27 mmol/L (ref 22–32)
Calcium: 9.1 mg/dL (ref 8.9–10.3)
Creatinine, Ser: 0.75 mg/dL (ref 0.44–1.00)
GFR calc Af Amer: >60 mL/min (ref >60)
GFR calc non Af Amer: >60 mL/min (ref >60)
GLUCOSE: 93 mg/dL (ref 65–99)
POTASSIUM: 4.3 mmol/L (ref 3.5–5.1)
Sodium: 141 mmol/L (ref 135–145)

## 2017-07-05 LAB — PREGNANCY, URINE: Preg Test, Ur: NEGATIVE

## 2017-07-11 ENCOUNTER — Encounter (HOSPITAL_COMMUNITY): Payer: Self-pay | Admitting: Surgery

## 2017-07-11 DIAGNOSIS — K8010 Calculus of gallbladder with chronic cholecystitis without obstruction: Secondary | ICD-10-CM | POA: Diagnosis present

## 2017-07-11 DIAGNOSIS — Z8719 Personal history of other diseases of the digestive system: Secondary | ICD-10-CM

## 2017-07-11 NOTE — H&P (Signed)
General Surgery [facility]  Miles Costain DOB: 1986-09-04 Single / Language: [place] / Race: Black or African American Female   History of Present Illness  The patient is a 31 year old female who presents for evaluation of gall stones.  CC: symptomatic cholelithiasis, hx of pancreatitis  Patient is referred from the emergency department and by her gynecologist, Dr. Normand Sloop, for evaluation of symptomatic cholelithiasis and history of biliary pancreatitis. Patient has had a history of intermittent abdominal pain consistent with biliary colic dating back to 2014. At that time she had an episode of biliary pancreatitis which was quite significant with elevated pancreatic enzymes and elevated liver function tests. Patient did not undergo cholecystectomy due to insurance concerns. 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 She had an episode one week ago which required evaluation at the emergency department. She had right upper quadrant abdominal pain. She underwent laboratory studies showing an elevated AST of 80 and an elevated lipase of 73. Abdominal ultrasound showed multiple gallstones. Symptoms have now resolved. Patient presents for evaluation for cholecystectomy. There is no family history of gallbladder disease. Patient has had no prior abdominal surgeries.   Allergies No Known Allergies 06/27/2017  Medication History  Oxycodone-Acetaminophen (5-325MG  Tablet, Oral) Active. Ondansetron (4MG  Tablet Disint, Oral) Active. Promethazine HCl (25MG  Tablet, Oral) Active. Medications Reconciled  Social History  Current tobacco use  Never smoker. No alcohol use  Caffeine use  Coffee. No drug use   Family History Diabetes Mellitus  Sister. High Blood Pressure / Hypertension  Sister. Migraine Headache  Sister.  Pregnancy / Birth History Age at menarche  10 years. Contraceptive History  Intrauterine  device. Pregnancies (Gravida)  1. Deliveries (Parity)  1. Length (months) of breastfeeding  7-12 months. Pap smear  1-5 years ago.  Vitals Weight: 158.2 lb Height: 65in Body Surface Area: 1.79 m Body Mass Index: 26.33 kg/m  Temp.: 98.33F(Oral)  Pulse: 69 (Regular)  BP: 116/68 (Sitting, Left Arm, Standard)  Physical Exam The physical exam findings are as follows: Note:CONSTITUTIONAL See vital signs recorded above  GENERAL APPEARANCE Development: normal Nutritional status: normal Gross deformities: none  SKIN Rash, lesions, ulcers: none Induration, erythema: none Nodules: none palpable  EYES Conjunctiva and lids: normal Pupils: equal and reactive Iris: normal bilaterally  EARS, NOSE, MOUTH, THROAT External ears: no lesion or deformity External nose: no lesion or deformity Hearing: grossly normal Lips: no lesion or deformity Dentition: normal for age Oral mucosa: moist  NECK Symmetric: yes Trachea: midline Thyroid: no palpable nodules in the thyroid bed  CHEST Respiratory effort: normal Retraction or accessory muscle use: no Breath sounds: normal bilaterally Rales, rhonchi, wheeze: none  CARDIOVASCULAR Auscultation: regular rhythm, normal rate Murmurs: none Pulses: carotid and radial pulse 2+ palpable Lower extremity edema: none Lower extremity varicosities: none  ABDOMEN Distension: none Masses: none palpable Tenderness: none Hepatosplenomegaly: not present Hernia: not present  MUSCULOSKELETAL Station and gait: normal Digits and nails: no clubbing or cyanosis Muscle strength: grossly normal all extremities Range of motion: grossly normal all extremities Deformity: none  LYMPHATIC Cervical: none palpable Supraclavicular: none palpable  PSYCHIATRIC Oriented to person, place, and time: yes Mood and affect: normal for situation Judgment and insight: appropriate for situation    Assessment & Plan   BILIARY ACUTE  PANCREATITIS (K85.10) CHOLELITHIASIS WITH CHRONIC CHOLECYSTITIS (K80.10)  Patient presents today for evaluation for cholecystectomy. She is accompanied by her sister. They are provided with written literature on gallbladder surgery to review  at home.  Patient has a history of biliary disease dating back at least 4 years. She has had intermittent episodes of biliary colic. She has had 2 episodes of biliary pancreatitis. She has had liver function test abnormalities. Ultrasound demonstrates multiple gallstones. Patient presents today for evaluation for cholecystectomy.  I recommended proceeding with laparoscopic cholecystectomy with intraoperative cholangiography. We discussed the risk of benefits of the procedure including the potential for conversion to open surgery. We have discussed the hospital stay to be anticipated. We have discussed her recovery and return to work and activity. She understands and wishes to proceed with surgery in the near future.  The risks and benefits of the procedure have been discussed at length with the patient. The patient understands the proposed procedure, potential alternative treatments, and the course of recovery to be expected. All of the patient's questions have been answered at this time. The patient wishes to proceed with surgery.  Velora Hecklerodd M. Satori Krabill, MD, [facility] Office: [phone]

## 2017-07-18 ENCOUNTER — Ambulatory Visit (HOSPITAL_COMMUNITY): Payer: BC Managed Care – PPO

## 2017-07-18 ENCOUNTER — Encounter (HOSPITAL_COMMUNITY)
Admission: RE | Disposition: A | Discharge status: Home / Self Care | Payer: Self-pay | Source: Ambulatory Visit | Attending: Surgery

## 2017-07-18 ENCOUNTER — Ambulatory Visit (HOSPITAL_COMMUNITY): Payer: BC Managed Care – PPO | Admitting: Anesthesiology

## 2017-07-18 ENCOUNTER — Encounter (HOSPITAL_COMMUNITY): Payer: Self-pay | Admitting: *Deleted

## 2017-07-18 ENCOUNTER — Observation Stay (HOSPITAL_COMMUNITY)
Admission: RE | Admit: 2017-07-18 | Discharge: 2017-07-20 | Disposition: A | Discharge status: Home / Self Care | Payer: BC Managed Care – PPO | Source: Ambulatory Visit | Attending: Surgery | Admitting: Surgery

## 2017-07-18 DIAGNOSIS — K8064 Calculus of gallbladder and bile duct with chronic cholecystitis without obstruction: Principal | ICD-10-CM | POA: Insufficient documentation

## 2017-07-18 DIAGNOSIS — K831 Obstruction of bile duct: Secondary | ICD-10-CM

## 2017-07-18 DIAGNOSIS — K851 Biliary acute pancreatitis without necrosis or infection: Secondary | ICD-10-CM | POA: Insufficient documentation

## 2017-07-18 DIAGNOSIS — Z8719 Personal history of other diseases of the digestive system: Secondary | ICD-10-CM

## 2017-07-18 DIAGNOSIS — K819 Cholecystitis, unspecified: Secondary | ICD-10-CM

## 2017-07-18 DIAGNOSIS — K8010 Calculus of gallbladder with chronic cholecystitis without obstruction: Secondary | ICD-10-CM | POA: Diagnosis present

## 2017-07-18 HISTORY — PX: CHOLECYSTECTOMY: SHX55

## 2017-07-18 SURGERY — LAPAROSCOPIC CHOLECYSTECTOMY WITH INTRAOPERATIVE CHOLANGIOGRAM
Anesthesia: General | Site: Abdomen

## 2017-07-18 MED ORDER — ROCURONIUM BROMIDE 10 MG/ML (PF) SYRINGE
PREFILLED_SYRINGE | INTRAVENOUS | Status: DC | PRN
  Administered 2017-07-18: 50 mg via INTRAVENOUS

## 2017-07-18 MED ORDER — MIDAZOLAM HCL 2 MG/2ML IJ SOLN
INTRAMUSCULAR | Status: DC | PRN
  Administered 2017-07-18: 2 mg via INTRAVENOUS

## 2017-07-18 MED ORDER — PROPOFOL 10 MG/ML IV BOLUS
INTRAVENOUS | Status: DC | PRN
  Administered 2017-07-18: 200 mg via INTRAVENOUS

## 2017-07-18 MED ORDER — LACTATED RINGERS IR SOLN
Status: DC | PRN
  Administered 2017-07-18: 1000 mL

## 2017-07-18 MED ORDER — ACETAMINOPHEN 650 MG RE SUPP: 650.0000 mg | Freq: Four times a day (QID) | RECTAL | Status: DC | PRN

## 2017-07-18 MED ORDER — BUPIVACAINE-EPINEPHRINE (PF) 0.25% -1:200000 IJ SOLN
INTRAMUSCULAR | Status: AC
  Filled 2017-07-18: qty 30

## 2017-07-18 MED ORDER — KCL IN DEXTROSE-NACL 20-5-0.45 MEQ/L-%-% IV SOLN
INTRAVENOUS | Status: DC
  Administered 2017-07-18 – 2017-07-19 (×2): via INTRAVENOUS
  Filled 2017-07-18 (×2): qty 1000

## 2017-07-18 MED ORDER — CEFAZOLIN SODIUM-DEXTROSE 2-4 GM/100ML-% IV SOLN
2.0000 g | INTRAVENOUS | Status: AC
  Administered 2017-07-18: 2 g via INTRAVENOUS
  Filled 2017-07-18: qty 100

## 2017-07-18 MED ORDER — ACETAMINOPHEN 325 MG PO TABS: 650.0000 mg | ORAL_TABLET | Freq: Four times a day (QID) | ORAL | Status: DC | PRN

## 2017-07-18 MED ORDER — MIDAZOLAM HCL 2 MG/2ML IJ SOLN
INTRAMUSCULAR | Status: AC
  Filled 2017-07-18: qty 2

## 2017-07-18 MED ORDER — FENTANYL CITRATE (PF) 100 MCG/2ML IJ SOLN
INTRAMUSCULAR | Status: AC
  Filled 2017-07-18: qty 2

## 2017-07-18 MED ORDER — DEXAMETHASONE SODIUM PHOSPHATE 10 MG/ML IJ SOLN
INTRAMUSCULAR | Status: DC | PRN
  Administered 2017-07-18: 10 mg via INTRAVENOUS

## 2017-07-18 MED ORDER — LIDOCAINE 2% (20 MG/ML) 5 ML SYRINGE
INTRAMUSCULAR | Status: DC | PRN
  Administered 2017-07-18: 60 mg via INTRAVENOUS

## 2017-07-18 MED ORDER — ONDANSETRON HCL 4 MG/2ML IJ SOLN: 4.0000 mg | Freq: Four times a day (QID) | INTRAMUSCULAR | Status: DC | PRN

## 2017-07-18 MED ORDER — IOPAMIDOL (ISOVUE-300) INJECTION 61%
INTRAVENOUS | Status: DC | PRN
  Administered 2017-07-18: 14.5 mL

## 2017-07-18 MED ORDER — SUGAMMADEX SODIUM 200 MG/2ML IV SOLN
INTRAVENOUS | Status: DC | PRN
  Administered 2017-07-18: 175 mg via INTRAVENOUS

## 2017-07-18 MED ORDER — HYDROCODONE-ACETAMINOPHEN 5-325 MG PO TABS: 1.0000 | ORAL_TABLET | ORAL | Status: DC | PRN

## 2017-07-18 MED ORDER — PROPOFOL 10 MG/ML IV BOLUS
INTRAVENOUS | Status: AC
  Filled 2017-07-18: qty 20

## 2017-07-18 MED ORDER — FENTANYL CITRATE (PF) 250 MCG/5ML IJ SOLN
INTRAMUSCULAR | Status: AC
  Filled 2017-07-18: qty 5

## 2017-07-18 MED ORDER — FENTANYL CITRATE (PF) 100 MCG/2ML IJ SOLN: 25.0000 ug | INTRAMUSCULAR | Status: DC | PRN

## 2017-07-18 MED ORDER — PROMETHAZINE HCL 25 MG/ML IJ SOLN: 6.2500 mg | INTRAMUSCULAR | Status: DC | PRN

## 2017-07-18 MED ORDER — LACTATED RINGERS IV SOLN
INTRAVENOUS | Status: DC
  Administered 2017-07-18 (×3): via INTRAVENOUS

## 2017-07-18 MED ORDER — ONDANSETRON 4 MG PO TBDP
4.0000 mg | ORAL_TABLET | Freq: Four times a day (QID) | ORAL | Status: DC | PRN
Start: 2017-07-18 — End: 2017-07-20

## 2017-07-18 MED ORDER — IOPAMIDOL (ISOVUE-300) INJECTION 61%
INTRAVENOUS | Status: AC
  Filled 2017-07-18: qty 50

## 2017-07-18 MED ORDER — BUPIVACAINE-EPINEPHRINE (PF) 0.25% -1:200000 IJ SOLN
INTRAMUSCULAR | Status: DC | PRN
  Administered 2017-07-18: 20 mL

## 2017-07-18 MED ORDER — 0.9 % SODIUM CHLORIDE (POUR BTL) OPTIME
TOPICAL | Status: DC | PRN
  Administered 2017-07-18: 1000 mL

## 2017-07-18 MED ORDER — ONDANSETRON HCL 4 MG/2ML IJ SOLN
INTRAMUSCULAR | Status: DC | PRN
  Administered 2017-07-18: 4 mg via INTRAVENOUS

## 2017-07-18 MED ORDER — FENTANYL CITRATE (PF) 250 MCG/5ML IJ SOLN
INTRAMUSCULAR | Status: DC | PRN
  Administered 2017-07-18: 25 ug via INTRAVENOUS
  Administered 2017-07-18: 100 ug via INTRAVENOUS
  Administered 2017-07-18 (×3): 50 ug via INTRAVENOUS
  Administered 2017-07-18: 25 ug via INTRAVENOUS
  Administered 2017-07-18: 50 ug via INTRAVENOUS
  Administered 2017-07-18: 100 ug via INTRAVENOUS

## 2017-07-18 MED ORDER — HYDROMORPHONE HCL-NACL 0.5-0.9 MG/ML-% IV SOSY
1.0000 mg | PREFILLED_SYRINGE | INTRAVENOUS | Status: DC | PRN
  Administered 2017-07-18 – 2017-07-19 (×5): 1 mg via INTRAVENOUS
  Filled 2017-07-18 (×5): qty 2

## 2017-07-18 SURGICAL SUPPLY — 31 items
APPLIER CLIP ROT 10 11.4 M/L (STAPLE) ×2
BENZOIN TINCTURE PRP APPL 2/3 (GAUZE/BANDAGES/DRESSINGS) ×2 IMPLANT
CABLE HIGH FREQUENCY MONO STRZ (ELECTRODE) ×2 IMPLANT
CHLORAPREP W/TINT 26ML (MISCELLANEOUS) ×2 IMPLANT
CLIP APPLIE ROT 10 11.4 M/L (STAPLE) ×1 IMPLANT
COVER MAYO STAND STRL (DRAPES) ×2 IMPLANT
COVER SURGICAL LIGHT HANDLE (MISCELLANEOUS) ×2 IMPLANT
DECANTER SPIKE VIAL GLASS SM (MISCELLANEOUS) IMPLANT
DRAPE C-ARM 42X120 X-RAY (DRAPES) ×2 IMPLANT
ELECT REM PT RETURN 15FT ADLT (MISCELLANEOUS) ×2 IMPLANT
GAUZE SPONGE 2X2 8PLY STRL LF (GAUZE/BANDAGES/DRESSINGS) ×1 IMPLANT
GLOVE SURG ORTHO 8.0 STRL STRW (GLOVE) ×2 IMPLANT
GOWN STRL REUS W/TWL XL LVL3 (GOWN DISPOSABLE) ×8 IMPLANT
HEMOSTAT SURGICEL 4X8 (HEMOSTASIS) IMPLANT
KIT BASIN OR (CUSTOM PROCEDURE TRAY) ×2 IMPLANT
POUCH SPECIMEN RETRIEVAL 10MM (ENDOMECHANICALS) ×2 IMPLANT
SCISSORS LAP 5X35 DISP (ENDOMECHANICALS) ×2 IMPLANT
SET CHOLANGIOGRAPH MIX (MISCELLANEOUS) ×2 IMPLANT
SET IRRIG TUBING LAPAROSCOPIC (IRRIGATION / IRRIGATOR) ×2 IMPLANT
SLEEVE XCEL OPT CAN 5 100 (ENDOMECHANICALS) ×2 IMPLANT
SPONGE GAUZE 2X2 STER 10/PKG (GAUZE/BANDAGES/DRESSINGS) ×1
STRIP CLOSURE SKIN 1/2X4 (GAUZE/BANDAGES/DRESSINGS) ×2 IMPLANT
SUT MNCRL AB 4-0 PS2 18 (SUTURE) ×2 IMPLANT
TAPE CLOTH SURG 4X10 WHT LF (GAUZE/BANDAGES/DRESSINGS) ×2 IMPLANT
TOWEL OR 17X26 10 PK STRL BLUE (TOWEL DISPOSABLE) ×2 IMPLANT
TOWEL OR NON WOVEN STRL DISP B (DISPOSABLE) ×2 IMPLANT
TRAY LAPAROSCOPIC (CUSTOM PROCEDURE TRAY) ×2 IMPLANT
TROCAR BLADELESS OPT 5 100 (ENDOMECHANICALS) ×2 IMPLANT
TROCAR XCEL BLUNT TIP 100MML (ENDOMECHANICALS) ×2 IMPLANT
TROCAR XCEL NON-BLD 11X100MML (ENDOMECHANICALS) ×2 IMPLANT
TUBING INSUF HEATED (TUBING) ×2 IMPLANT

## 2017-07-18 NOTE — Anesthesia Preprocedure Evaluation (Addendum)
Anesthesia Evaluation  Patient identified by MRN, date of birth, ID band Patient awake    Reviewed: Allergy & Precautions, NPO status , Patient's Chart, lab work & pertinent test results  Airway Mallampati: I  TM Distance: >3 FB Neck ROM: Full    Dental  (+) Dental Advisory Given, Teeth Intact   Pulmonary neg pulmonary ROS,    Pulmonary exam normal breath sounds clear to auscultation       Cardiovascular Exercise Tolerance: Good negative cardio ROS Normal cardiovascular exam Rhythm:Regular Rate:Normal     Neuro/Psych negative neurological ROS  negative psych ROS   GI/Hepatic Neg liver ROS, Chronic cholecystitis   Endo/Other  negative endocrine ROS  Renal/GU negative Renal ROS  negative genitourinary   Musculoskeletal negative musculoskeletal ROS (+)   Abdominal   Peds  Hematology negative hematology ROS (+)   Anesthesia Other Findings   Reproductive/Obstetrics                             Anesthesia Physical  Anesthesia Plan  ASA: II  Anesthesia Plan: General   Post-op Pain Management:    Induction: Intravenous  PONV Risk Score and Plan: 4 or greater and Ondansetron, Dexamethasone, Midazolam, Scopolamine patch - Pre-op, Propofol infusion and Treatment may vary due to age or medical condition  Airway Management Planned: Oral ETT  Additional Equipment: None  Intra-op Plan:   Post-operative Plan: Extubation in OR  Informed Consent: I have reviewed the patients History and Physical, chart, labs and discussed the procedure including the risks, benefits and alternatives for the proposed anesthesia with the patient or authorized representative who has indicated his/her understanding and acceptance.   Dental advisory given  Plan Discussed with: CRNA  Anesthesia Plan Comments:         Anesthesia Quick Evaluation  

## 2017-07-18 NOTE — Progress Notes (Signed)
Patient ID: Olivia CostainShannon D Inoue, female   DOB: 1985-12-13, 31 y.o.   MRN: [id number]  General Surgery [facility]  Patient seen post op on [address].  Explained operative findings - chronic cholecystitis, cholelithiasis (hundreds of stones), and probable distal CBD stone.  [facility] to consult this evening and hopefully proceed with ERCP tomorrow.  Patient comfortable and agrees with plans for management.  Velora Hecklerodd M. Albert Devaul, MD, [facility] Office: [phone]

## 2017-07-18 NOTE — Consult Note (Signed)
Referring Provider: Dr. Gerrit Friends  Primary Care Physician:  Patient, No Pcp Per Primary Gastroenterologist:  unassigned  Reason for Consultation:  Abnormal IOC/ choledocholithiasis  HPI: Olivia Gordon is a 31 y.o. female who underwent laparoscopic cholecystectomy today for chronic cholecystitis, cholelithiasis and history of biliary pancreatitis. IOC was performed and it showed filling defect in the common bile duct consistent with choledocholithiasis. GI is consulted for further evaluation and to perform ERCP.  Patient seen and examined. 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 Today, she denied any abdominal pain, nausea or vomiting. She denied any diarrhea or constipation. Denied blood in the stool or black stool. No dysphagia or odynophagia  Family history of colon cancer or colon polyps.  History reviewed. No pertinent past medical history.  Past Surgical History:  Procedure Laterality Date  . CHOLECYSTECTOMY N/A 07/18/2017   Procedure: LAPAROSCOPIC CHOLECYSTECTOMY WITH INTRAOPERATIVE CHOLANGIOGRAM;  Surgeon: Darnell Level, MD;  Location: [facility];  Service: General;  Laterality: N/A;    Prior to Admission medications   Medication Sig Start Date End Date Taking? Authorizing Provider  acetaminophen (TYLENOL) 500 MG tablet Take 1,000 mg by mouth daily as needed for moderate pain or headache.   Yes [provider]  levonorgestrel (MIRENA, 52 MG,) 20 MCG/24HR IUD Mirena 20 mcg/24 hr (5 years) intrauterine device   Yes [provider]  ondansetron (ZOFRAN ODT) 4 MG disintegrating tablet Take 1 tablet (4 mg total) by mouth every 8 (eight) hours as needed for nausea or vomiting. Patient not taking: Reported on 07/04/2017 06/21/17   Audry Pili, PA-C  oxyCODONE-acetaminophen (PERCOCET/ROXICET) 5-325 MG tablet Take 1 tablet by mouth every 6 (six) hours as needed for severe pain. Patient not taking: Reported  on 07/04/2017 06/21/17   Audry Pili, PA-C    Scheduled Meds: Continuous Infusions: . dextrose 5 % and 0.45 % NaCl with KCl 20 mEq/L     PRN Meds:.acetaminophen **OR** acetaminophen, HYDROcodone-acetaminophen, HYDROmorphone (DILAUDID) injection, ondansetron **OR** ondansetron (ZOFRAN) IV  Allergies as of 07/01/2017  . (No Known Allergies)    History reviewed. No pertinent family history.  Social History   Social History  . Marital status: Single    Spouse name: N/A  . Number of children: N/A  . Years of education: N/A   Occupational History  . Not on file.   Social History Main Topics  . Smoking status: Never Smoker  . Smokeless tobacco: Never Used  . Alcohol use No  . Drug use: No  . Sexual activity: Yes    Birth control/ protection: IUD   Other Topics Concern  . Not on file   Social History Narrative  . No narrative on file    Review of Systems: Review of Systems  Constitutional: Negative for chills and fever.  HENT: Negative for ear discharge, ear pain, hearing loss and tinnitus.   Eyes: Negative for blurred vision and double vision.  Respiratory: Negative for cough, hemoptysis, sputum production and shortness of breath.   Cardiovascular: Negative for chest pain and palpitations.  Gastrointestinal: Negative for abdominal pain, blood in stool, constipation, diarrhea, heartburn, melena, nausea and vomiting.  Genitourinary: Negative for dysuria and urgency.  Musculoskeletal: Negative for myalgias and neck pain.  Skin: Negative for itching and rash.  Neurological: Negative for focal weakness and loss of consciousness.  Endo/Heme/Allergies: Does not bruise/bleed easily.  Psychiatric/Behavioral: Negative for hallucinations and suicidal ideas.    Physical Exam: Vital signs: Vitals:   07/18/17  1430 07/18/17 1440  BP: 127/89 137/86  Pulse: 73 83  Resp: 14 14  Temp: 97.6 F (36.4 C) 97.7 F (36.5 C)  SpO2: 99% 100%     Physical Exam  Constitutional: She is  oriented to person, place, and time. She appears well-developed and well-nourished. No distress.  HENT:  Head: Normocephalic and atraumatic.  Mouth/Throat: Oropharynx is clear and moist.  Eyes: EOM are normal. No scleral icterus.  Neck: Normal range of motion. Neck supple. No thyromegaly present.  Cardiovascular: Normal rate, regular rhythm and normal heart sounds.   No murmur heard. Pulmonary/Chest: Effort normal and breath sounds normal. No respiratory distress.  Abdominal: Soft. Bowel sounds are normal. She exhibits no distension. There is no tenderness. There is no rebound and no guarding.  Musculoskeletal: Normal range of motion. She exhibits no edema.  Neurological: She is alert and oriented to person, place, and time.  Skin: Skin is warm. No erythema.  Psychiatric: She has a normal mood and affect. Her behavior is normal.  Vitals reviewed.   GI:  Lab Results: No results for input(s): WBC, HGB, HCT, PLT in the last 72 hours. BMET No results for input(s): NA, K, CL, CO2, GLUCOSE, BUN, CREATININE, CALCIUM in the last 72 hours. LFT No results for input(s): PROT, ALBUMIN, AST, ALT, ALKPHOS, BILITOT, BILIDIR, IBILI in the last 72 hours. PT/INR No results for input(s): LABPROT, INR in the last 72 hours.   Studies/Results: Dg Cholangiogram Operative  Result Date: 07/18/2017 CLINICAL DATA:  Cholecystitis. EXAM: INTRAOPERATIVE CHOLANGIOGRAM TECHNIQUE: Cholangiographic images from the C-arm fluoroscopic device were submitted for interpretation post-operatively. Please see the procedural report for the amount of contrast and the fluoroscopy time utilized. COMPARISON:  Ultrasound 06/21/2017 FINDINGS: Cystic duct was cannulated and contrast was injected into the biliary system. The initial images demonstrate a nonocclusive filling defect in distal common bile duct compatible with a stone. There was some drainage into the duodenum. Contrast refluxes into the intrahepatic and extrahepatic  biliary system. Mild dilatation of the extrahepatic biliary system. There is extravasation of contrast at the cystic duct related to the cannulation site. The stone in the distal common bile duct is more obstructive on the final images. IMPRESSION: Persistent filling defect in the distal common bile duct is compatible with a stone. There appears to be intermittent or partial obstruction related to this stone. Electronically Signed   By: Richarda OverlieAdam  Henn M.D.   On: 07/18/2017 13:14    Impression/Plan: - Abnormal IOC - possible choledocholithiasis  Recommendations ------------------------- - ERCP tomorrow depending on anesthesia availability. Risk, benefits and alternatives discussed with the patient. She verbalized understanding. - Continue clear liquid diet for now. Nothing by mouth past midnight. - Preop Zosyn to be given tomorrow. - LFTs for tomorrow. GI will follow    LOS: 0 days   Kathi DerParag Aleksandra Raben  MD, FACP 07/18/2017, 4:22 PM  Pager [phone] If no answer or after 5 PM call [phone]

## 2017-07-18 NOTE — Progress Notes (Signed)
Report given to Marcelino DusterMichelle, RN and care assumed by CadyvilleMichelle at this time. Pt is resting well and has no s/s of distress. VS and orders assessed and will continue to monitor and tx pt according to MD orders.

## 2017-07-18 NOTE — Interval H&P Note (Signed)
History and Physical Interval Note:  07/18/2017 11:11 AM  Olivia Gordon  has presented today for surgery, with the diagnosis of Chronic cholecystitis, CHOLELITHIASIS, biliary pancreatitis.  The various methods of treatment have been discussed with the patient and family. After consideration of risks, benefits and other options for treatment, the patient has consented to    Procedure(s): LAPAROSCOPIC CHOLECYSTECTOMY WITH INTRAOPERATIVE CHOLANGIOGRAM (N/A) as a surgical intervention .    The patient's history has been reviewed, patient examined, no change in status, stable for surgery.  I have reviewed the patient's chart and labs.  Questions were answered to the patient's satisfaction.    Velora Hecklerodd M. Emelie Newsom, MD, [facility] Office: [phone]    Alianys Chacko MJudie Petit

## 2017-07-18 NOTE — Progress Notes (Signed)
Care reassumed for this patient at this time.

## 2017-07-18 NOTE — Anesthesia Procedure Notes (Signed)
Procedure Name: Intubation Date/Time: 07/18/2017 11:50 AM Performed by: Minerva EndsMIRARCHI, Koraline Phillipson M Pre-anesthesia Checklist: Patient identified, Emergency Drugs available, Suction available and Patient being monitored Patient Re-evaluated:Patient Re-evaluated prior to induction Oxygen Delivery Method: Circle System Utilized Preoxygenation: Pre-oxygenation with 100% oxygen Induction Type: IV induction Ventilation: Mask ventilation without difficulty Laryngoscope Size: Miller and 2 Grade View: Grade I Tube type: Oral Tube size: 7.0 mm Number of attempts: 1 Airway Equipment and Method: Stylet Placement Confirmation: ETT inserted through vocal cords under direct vision,  positive ETCO2 and breath sounds checked- equal and bilateral Secured at: 22 cm Tube secured with: Tape Dental Injury: Teeth and Oropharynx as per pre-operative assessment  Comments: Smooth IV induction Mal AmabileBrock--- intubation AM CRNA atraumatic-- teeth and mouth as preop-- chipped front tooth present prior to laryngoscopy--- pt did not report on preop exam--- bilat BS Mal AmabileBrock

## 2017-07-18 NOTE — Anesthesia Procedure Notes (Signed)
Date/Time: 07/18/2017 1:15 PM Performed by: Minerva EndsMIRARCHI, Etheridge Geil M Oxygen Delivery Method: Simple face mask Placement Confirmation: breath sounds checked- equal and bilateral and positive ETCO2 Dental Injury: Teeth and Oropharynx as per pre-operative assessment  Comments: Extubated to face mask--- O2 intact good aw to pacu

## 2017-07-18 NOTE — Transfer of Care (Signed)
Immediate Anesthesia Transfer of Care Note  Patient: Olivia CostainShannon D Gordon  Procedure(s) Performed: Procedure(s): LAPAROSCOPIC CHOLECYSTECTOMY WITH INTRAOPERATIVE CHOLANGIOGRAM (N/A)  Patient Location: PACU  Anesthesia Type:General  Level of Consciousness: sedated  Airway & Oxygen Therapy: Patient Spontanous Breathing and Patient connected to face mask oxygen  Post-op Assessment: Report given to RN and Post -op Vital signs reviewed and stable  Post vital signs: Reviewed and stable  Last Vitals:  Vitals:   07/18/17 0918  BP: 130/84  Pulse: 67  Resp: 16  Temp: 36.9 C  SpO2: 100%    Last Pain:  Vitals:   07/18/17 0938  TempSrc:   PainSc: 0-No pain      Patients Stated Pain Goal: 3 (07/18/17 [id number])  Complications: No apparent anesthesia complications

## 2017-07-18 NOTE — Anesthesia Postprocedure Evaluation (Signed)
Anesthesia Post Note  Patient: Miles CostainShannon D Bauer  Procedure(s) Performed: Procedure(s) (LRB): LAPAROSCOPIC CHOLECYSTECTOMY WITH INTRAOPERATIVE CHOLANGIOGRAM (N/A)     Patient location during evaluation: PACU Anesthesia Type: General Level of consciousness: awake and alert Pain management: pain level controlled Vital Signs Assessment: post-procedure vital signs reviewed and stable Respiratory status: spontaneous breathing, nonlabored ventilation and respiratory function stable Cardiovascular status: blood pressure returned to baseline and stable Postop Assessment: no signs of nausea or vomiting Anesthetic complications: no    Last Vitals:  Vitals:   07/18/17 1320 07/18/17 1400  BP: (!) 149/84 128/76  Pulse: 99 95  Resp: 10 15  Temp: 36.8 C   SpO2: 100% 100%    Last Pain:  Vitals:   07/18/17 1400  TempSrc:   PainSc: 0-No pain                 Beryle Lathehomas E Malan Werk

## 2017-07-18 NOTE — Op Note (Signed)
Procedure Note  Pre-operative Diagnosis:  Chronic cholecystitis, cholelithiasis, history of biliary pancreatitis  Post-operative Diagnosis:  Same, probable choledocholithiasis  Surgeon:  Olivia Heckler, MD, FACS  Assistant:  Gaynelle Adu, MD, FACS   Procedure:  Laparoscopic cholecystectomy with intra-operative cholangiography  Anesthesia:  General  Estimated Blood Loss:  minimal  Drains: none         Specimen: Gallbladder to pathology  Indications:  The patient is a 31 year old female who presents for evaluation of gall stones.  Patient is referred from the emergency department and by her gynecologist, Dr. Normand Sloop, for evaluation of symptomatic cholelithiasis and history of biliary pancreatitis. Patient has had a history of intermittent abdominal pain consistent with biliary colic dating back to 2014. At that time she had an episode of biliary pancreatitis which was quite significant with elevated pancreatic enzymes and elevated liver function tests. Patient did not undergo cholecystectomy due to insurance concerns. 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 She had an episode one week ago which required evaluation at the emergency department. She had right upper quadrant abdominal pain. She underwent laboratory studies showing an elevated AST of 80 and an elevated lipase of 73. Abdominal ultrasound showed multiple gallstones. Symptoms have now resolved. Patient presents for evaluation for cholecystectomy. There is no family history of gallbladder disease. Patient has had no prior abdominal surgeries.  Procedure Details:  The patient was seen in the pre-op holding area. The risks, benefits, complications, treatment options, and expected outcomes were previously discussed with the patient. The patient agreed with the proposed plan and has signed the informed consent form.  The patient was brought to the Operating Room, identified as Olivia Gordon and the procedure verified as laparoscopic cholecystectomy with intraoperative cholangiography. A "time out" was completed and the above information confirmed.  The patient was placed in the supine position. Following induction of general anesthesia, the abdomen was prepped and draped in the usual aseptic fashion.  An incision was made in the skin near the umbilicus. The midline fascia was incised and the peritoneal cavity was entered and a Hasson canula was introduced under direct vision.  The Hasson canula was secured with a 0-Vicryl pursestring suture. Pneumoperitoneum was established with carbon dioxide. Additional trocars were introduced under direct vision along the right costal margin in the midline, mid-clavicular line, and anterior axillary line.   The gallbladder was identified and the fundus grasped and retracted cephalad. Adhesions were taken down bluntly and the electrocautery was utilized as needed, taking care not to injure any adjacent structures. The infundibulum was grasped and retracted laterally, exposing the peritoneum overlying the triangle of Calot. The peritoneum was incised and structures exposed with blunt dissection. The cystic duct was clearly identified, bluntly dissected circumferentially, and clipped at the neck of the gallbladder.  An incision was made in the cystic duct and the cholangiogram catheter introduced. The catheter was secured using an ligaclip.  Real-time cholangiography was performed using C-arm fluoroscopy.  There was filling of a normal caliber common bile duct.  There was reflux of contrast into the left and right hepatic ductal systems.  There was flow distally which did not appear to enter the duodenum.  There appears to be a filling defect in the distal common bile duct consistent with choledocholithiasis.  The catheter was removed from the peritoneal cavity.  The cystic duct was then ligated with surgical clips and divided. The cystic artery was  identified, dissected circumferentially, ligated with  ligaclips, and divided.  The gallbladder was dissected away from the liver bed using the electrocautery for hemostasis. The gallbladder was completely removed from the liver and placed into an endocatch bag. The gallbladder was removed in the endocatch bag through the umbilical port site and submitted to pathology for review.  The right upper quadrant was irrigated and the gallbladder bed was inspected. Hemostasis was achieved with the electrocautery.  Pneumoperitoneum was released after viewing removal of the trocars with good hemostasis noted. The umbilical wound was irrigated and the fascia was then closed with the pursestring suture.  Local anesthetic was infiltrated at all port sites. The skin incisions were closed with 4-0 Monocril subcuticular sutures and steri-strips and dressings were applied.  Instrument, sponge, and needle counts were correct at the conclusion of the case.  The patient was awakened from anesthesia and brought to the recovery room in stable condition.  The patient tolerated the procedure well.   Olivia Hecklerodd M. Yorley Buch, MD, [facility] Office: [phone]

## 2017-07-19 ENCOUNTER — Observation Stay (HOSPITAL_COMMUNITY): Payer: BC Managed Care – PPO

## 2017-07-19 ENCOUNTER — Encounter (HOSPITAL_COMMUNITY)
Admission: RE | Disposition: A | Discharge status: Home / Self Care | Payer: Self-pay | Source: Ambulatory Visit | Attending: Surgery

## 2017-07-19 ENCOUNTER — Encounter (HOSPITAL_COMMUNITY): Payer: Self-pay | Admitting: *Deleted

## 2017-07-19 ENCOUNTER — Observation Stay (HOSPITAL_COMMUNITY): Payer: BC Managed Care – PPO | Admitting: Anesthesiology

## 2017-07-19 DIAGNOSIS — K8064 Calculus of gallbladder and bile duct with chronic cholecystitis without obstruction: Secondary | ICD-10-CM | POA: Diagnosis not present

## 2017-07-19 HISTORY — PX: ERCP: SHX5425

## 2017-07-19 LAB — COMPREHENSIVE METABOLIC PANEL
ALT: 164 U/L — ABNORMAL HIGH (ref 14–54)
ANION GAP: 5 (ref 5–15)
AST: 168 U/L — AB (ref 15–41)
Albumin: 3.3 g/dL — ABNORMAL LOW (ref 3.5–5.0)
Alkaline Phosphatase: 56 U/L (ref 38–126)
BILIRUBIN TOTAL: 0.7 mg/dL (ref 0.3–1.2)
BUN: <5 mg/dL — ABNORMAL LOW (ref 6–20)
CHLORIDE: 109 mmol/L (ref 101–111)
CO2: 24 mmol/L (ref 22–32)
Calcium: 8.7 mg/dL — ABNORMAL LOW (ref 8.9–10.3)
Creatinine, Ser: 0.6 mg/dL (ref 0.44–1.00)
GFR calc Af Amer: >60 mL/min (ref >60)
GFR calc non Af Amer: >60 mL/min (ref >60)
GLUCOSE: 114 mg/dL — AB (ref 65–99)
POTASSIUM: 3.8 mmol/L (ref 3.5–5.1)
Sodium: 138 mmol/L (ref 135–145)
TOTAL PROTEIN: 5.8 g/dL — AB (ref 6.5–8.1)

## 2017-07-19 LAB — MRSA PCR SCREENING: MRSA BY PCR: NEGATIVE

## 2017-07-19 SURGERY — ERCP, WITH INTERVENTION IF INDICATED
Anesthesia: General | Laterality: Left

## 2017-07-19 MED ORDER — LIDOCAINE HCL (CARDIAC) 20 MG/ML IV SOLN
INTRAVENOUS | Status: DC | PRN
  Administered 2017-07-19: 25 mg via INTRATRACHEAL
  Administered 2017-07-19: 100 mg via INTRAVENOUS

## 2017-07-19 MED ORDER — PROPOFOL 10 MG/ML IV BOLUS
INTRAVENOUS | Status: DC | PRN
  Administered 2017-07-19: 200 mg via INTRAVENOUS

## 2017-07-19 MED ORDER — FENTANYL CITRATE (PF) 100 MCG/2ML IJ SOLN
INTRAMUSCULAR | Status: AC
  Filled 2017-07-19: qty 2

## 2017-07-19 MED ORDER — DEXAMETHASONE SODIUM PHOSPHATE 10 MG/ML IJ SOLN
INTRAMUSCULAR | Status: AC
  Filled 2017-07-19: qty 1

## 2017-07-19 MED ORDER — SUGAMMADEX SODIUM 200 MG/2ML IV SOLN
INTRAVENOUS | Status: DC | PRN
  Administered 2017-07-19: 120 mg via INTRAVENOUS

## 2017-07-19 MED ORDER — INDOMETHACIN 50 MG RE SUPP
RECTAL | Status: DC | PRN
  Administered 2017-07-19: 100 mg via RECTAL

## 2017-07-19 MED ORDER — ONDANSETRON HCL 4 MG/2ML IJ SOLN
INTRAMUSCULAR | Status: DC | PRN
  Administered 2017-07-19: 4 mg via INTRAVENOUS

## 2017-07-19 MED ORDER — INDOMETHACIN 50 MG RE SUPP
RECTAL | Status: AC
  Filled 2017-07-19: qty 2

## 2017-07-19 MED ORDER — ZOLPIDEM TARTRATE 5 MG PO TABS
5.0000 mg | ORAL_TABLET | Freq: Once | ORAL | Status: AC
Start: 2017-07-19 — End: 2017-07-20
  Administered 2017-07-20: 5 mg via ORAL
  Filled 2017-07-19: qty 1

## 2017-07-19 MED ORDER — MIDAZOLAM HCL 2 MG/2ML IJ SOLN
INTRAMUSCULAR | Status: AC
  Filled 2017-07-19: qty 2

## 2017-07-19 MED ORDER — SODIUM CHLORIDE 0.9 % IV SOLN
INTRAVENOUS | Status: DC | PRN
  Administered 2017-07-19: 30 mL

## 2017-07-19 MED ORDER — FENTANYL CITRATE (PF) 100 MCG/2ML IJ SOLN
INTRAMUSCULAR | Status: DC | PRN
  Administered 2017-07-19: 100 ug via INTRAVENOUS

## 2017-07-19 MED ORDER — SODIUM CHLORIDE 0.9 % IV SOLN: INTRAVENOUS | Status: DC

## 2017-07-19 MED ORDER — SUGAMMADEX SODIUM 200 MG/2ML IV SOLN
INTRAVENOUS | Status: AC
  Filled 2017-07-19: qty 2

## 2017-07-19 MED ORDER — ONDANSETRON HCL 4 MG/2ML IJ SOLN
INTRAMUSCULAR | Status: AC
  Filled 2017-07-19: qty 2

## 2017-07-19 MED ORDER — PIPERACILLIN-TAZOBACTAM 3.375 G IVPB 30 MIN
3.3750 g | INTRAVENOUS | Status: AC
  Administered 2017-07-19: 3.375 g via INTRAVENOUS
  Filled 2017-07-19: qty 50

## 2017-07-19 MED ORDER — FENTANYL CITRATE (PF) 100 MCG/2ML IJ SOLN: 25.0000 ug | INTRAMUSCULAR | Status: DC | PRN

## 2017-07-19 MED ORDER — ROCURONIUM BROMIDE 50 MG/5ML IV SOSY
PREFILLED_SYRINGE | INTRAVENOUS | Status: AC
  Filled 2017-07-19: qty 5

## 2017-07-19 MED ORDER — GLUCAGON HCL RDNA (DIAGNOSTIC) 1 MG IJ SOLR
INTRAMUSCULAR | Status: AC
  Filled 2017-07-19: qty 1

## 2017-07-19 MED ORDER — DEXAMETHASONE SODIUM PHOSPHATE 10 MG/ML IJ SOLN
INTRAMUSCULAR | Status: DC | PRN
  Administered 2017-07-19: 10 mg via INTRAVENOUS

## 2017-07-19 MED ORDER — PROMETHAZINE HCL 25 MG/ML IJ SOLN: 6.2500 mg | INTRAMUSCULAR | Status: DC | PRN

## 2017-07-19 MED ORDER — LIDOCAINE 2% (20 MG/ML) 5 ML SYRINGE
INTRAMUSCULAR | Status: AC
  Filled 2017-07-19: qty 5

## 2017-07-19 MED ORDER — MIDAZOLAM HCL 5 MG/5ML IJ SOLN
INTRAMUSCULAR | Status: DC | PRN
  Administered 2017-07-19: 2 mg via INTRAVENOUS

## 2017-07-19 MED ORDER — PROPOFOL 10 MG/ML IV BOLUS
INTRAVENOUS | Status: AC
Start: 2017-07-19 — End: 2017-07-19
  Filled 2017-07-19: qty 20

## 2017-07-19 MED ORDER — LACTATED RINGERS IV SOLN
INTRAVENOUS | Status: DC | PRN
  Administered 2017-07-19: 14:00:00 via INTRAVENOUS

## 2017-07-19 MED ORDER — ROCURONIUM BROMIDE 100 MG/10ML IV SOLN
INTRAVENOUS | Status: DC | PRN
  Administered 2017-07-19: 35 mg via INTRAVENOUS

## 2017-07-19 NOTE — Progress Notes (Signed)
[facility]  Miles CostainShannon D Gordon 31 y.o. 1986/02/22  CC:  Abnormal IOC, Choledocholithiasis   Subjective: patient had 1 episode of nausea this morning. C/O  of mild pain at incision sites. Denied abdominal distention. Denied vomiting. Denied diarrhea or constipation   Objective: Vital signs in last 24 hours: Vitals:   07/19/17 0144 07/19/17 0546  BP: 137/81 129/68  Pulse: 75 76  Resp: 16 16  Temp: 98.5 F (36.9 C) 98.7 F (37.1 C)  SpO2: 100% 98%    Physical Exam:  General:  Alert, cooperative, no distress, appears stated age  Head:  Normocephalic, without obvious abnormality, atraumatic  Eyes:  , EOM's intact,   Lungs:   Clear to auscultation bilaterally, respirations unlabored  Heart:  Regular rate and rhythm, S1, S2 normal  Abdomen:   Soft, non-tender, bowel sounds active all four quadrants,  no masses,   Extremities: Extremities normal, atraumatic, no  edema  Pulses: 2+ and symmetric    Lab Results:  Recent Labs  07/19/17 0449  NA 138  K 3.8  CL 109  CO2 24  GLUCOSE 114*  BUN <5*  CREATININE 0.60  CALCIUM 8.7*    Recent Labs  07/19/17 0449  AST 168*  ALT 164*  ALKPHOS 56  BILITOT 0.7  PROT 5.8*  ALBUMIN 3.3*   No results for input(s): WBC, NEUTROABS, HGB, HCT, MCV, PLT in the last 72 hours. No results for input(s): LABPROT, INR in the last 72 hours.    Assessment/Plan: Abnormal IOC -  Choledocholithiasis Elevated LFTs  Recommendations ------------------------- - ERCP today. Risk benefits alternatives discussed with the patient. She verbalized understanding. - Consider hepatitis panel, if persistent elevated LFTs to rule out an underlying chronic hepatitis. - Preop Zosyn. - Further plan based on ERCP findings.  Olivia DerParag Emelin Dascenzo MD, FACP 07/19/2017, 8:23 AM  Pager [phone]  If no answer or after 5 PM call [phone]

## 2017-07-19 NOTE — Progress Notes (Signed)
General Surgery [facility]  Assessment & Plan: POD#1 status post lap chole with IOC  Doing well post op  ERCP today at 230PM  Will stay overnight following procedure - I will discharge in the AM 9/8  Appreciate GI consult and services        Velora Heckler, MD, [facility]       Office: [phone]    Chief Complaint: Chronic cholecystitis, choledocholithiasis  Subjective: Patient comfortable in bed, fiance at bedside; awaiting ERCP this afternoon  Objective: Vital signs in last 24 hours: Temp:  [97.6 F (36.4 C)-99 F (37.2 C)] 98.7 F (37.1 C) (09/07 0546) Pulse Rate:  [65-99] 76 (09/07 0546) Resp:  [10-16] 16 (09/07 0546) BP: (127-149)/(68-89) 129/68 (09/07 0546) SpO2:  [97 %-100 %] 98 % (09/07 0546) Last BM Date: 07/17/17  Intake/Output from previous day: 09/06 0701 - 09/07 0700 In: 1740 [P.O.:240; I.V.:1500] Out: 3160 [Urine:3150; Blood:10] Intake/Output this shift: Total I/O In: 300 [I.V.:300] Out: -   Physical Exam: HEENT - sclerae clear, mucous membranes moist Neck - soft Abdomen - soft; dressings dry Ext - no edema, non-tender Neuro - alert & oriented, no focal deficits  Lab Results:  No results for input(s): WBC, HGB, HCT, PLT in the last 72 hours. BMET  Recent Labs  07/19/17 0449  NA 138  K 3.8  CL 109  CO2 24  GLUCOSE 114*  BUN <5*  CREATININE 0.60  CALCIUM 8.7*   PT/INR No results for input(s): LABPROT, INR in the last 72 hours. Comprehensive Metabolic Panel:    Component Value Date/Time   NA 138 07/19/2017 0449   NA 141 07/05/2017 0843   K 3.8 07/19/2017 0449   K 4.3 07/05/2017 0843   CL 109 07/19/2017 0449   CL 109 07/05/2017 0843   CO2 24 07/19/2017 0449   CO2 27 07/05/2017 0843   BUN <5 (L) 07/19/2017 0449   BUN 7 07/05/2017 0843   CREATININE 0.60 07/19/2017 0449   CREATININE 0.75 07/05/2017 0843   GLUCOSE 114 (H) 07/19/2017 0449   GLUCOSE 93 07/05/2017 0843   CALCIUM 8.7 (L) 07/19/2017 0449   CALCIUM 9.1 07/05/2017 0843   AST 168 (H) 07/19/2017 0449   AST 80 (H) 06/20/2017 1902   ALT 164 (H) 07/19/2017 0449   ALT 47 06/20/2017 1902   ALKPHOS 56 07/19/2017 0449   ALKPHOS 49 06/20/2017 1902   BILITOT 0.7 07/19/2017 0449   BILITOT 1.1 06/20/2017 1902   PROT 5.8 (L) 07/19/2017 0449   PROT 7.4 06/20/2017 1902   ALBUMIN 3.3 (L) 07/19/2017 0449   ALBUMIN 4.2 06/20/2017 1902    Studies/Results: Dg Cholangiogram Operative  Result Date: 07/18/2017 CLINICAL DATA:  Cholecystitis. EXAM: INTRAOPERATIVE CHOLANGIOGRAM TECHNIQUE: Cholangiographic images from the C-arm fluoroscopic device were submitted for interpretation post-operatively. Please see the procedural report for the amount of contrast and the fluoroscopy time utilized. COMPARISON:  Ultrasound 06/21/2017 FINDINGS: Cystic duct was cannulated and contrast was injected into the biliary system. The initial images demonstrate a nonocclusive filling defect in distal common bile duct compatible with a stone. There was some drainage into the duodenum. Contrast refluxes into the intrahepatic and extrahepatic biliary system. Mild dilatation of the extrahepatic biliary system. There is extravasation of contrast at the cystic duct related to the cannulation site. The stone in the distal common bile duct is more obstructive on the final images. IMPRESSION: Persistent filling defect in the distal common  bile duct is compatible with a stone. There appears to be intermittent or partial obstruction related to this stone. Electronically Signed   By: Richarda OverlieAdam  Henn Gordon.D.   On: 07/18/2017 13:14      Olivia Gordon 07/19/2017  Patient ID: Olivia Gordon, female   DOB: August 30, 1986, 31 y.o.   MRN: [id number]

## 2017-07-19 NOTE — Op Note (Signed)
[facility] Patient Name: Olivia LovelyShannon Gordon Procedure Date: 07/19/2017 MRN: [id number] Attending MD: Kerin SalenArya Vilma Will , MD Date of Birth: 05-Jan-1986 CSN: [id number] Age: [age] Admit Type: Inpatient Procedure:                ERCP Indications:              Filling defect on intraoperative cholangiogram Providers:                Kerin SalenArya Skyah Hannon, MD, Dwain SarnaPatricia Ford, RN, Margo AyeValeria McKoy,                            Technician Referring MD:              Medicines:                Monitored Anesthesia Care Complications:            No immediate complications. Estimated Blood Loss:     Estimated blood loss: none. Procedure:                Pre-Anesthesia Assessment:                           - Prior to the procedure, a History and Physical                            was performed, and patient medications and                            allergies were reviewed. The patient's tolerance of                            previous anesthesia was also reviewed. The risks                            and benefits of the procedure and the sedation                            options and risks were discussed with the patient.                            All questions were answered, and informed consent                            was obtained. Prior Anticoagulants: The patient has                            taken no previous anticoagulant or antiplatelet                            agents. ASA Grade Assessment: II - A patient with                            mild systemic disease. After reviewing the risks  and benefits, the patient was deemed in                            satisfactory condition to undergo the procedure.                           After obtaining informed consent, the scope was                            passed under direct vision. Throughout the                            procedure, the patient's blood pressure, pulse, and                            oxygen saturations were  monitored continuously. The                            ZO-[id number] ([id number]) scope was introduced through                            the mouth, and used to inject contrast into and                            used to cannulate the bile duct. The ERCP was                            accomplished without difficulty. The patient                            tolerated the procedure well. Scope In: Scope Out: Findings:      The scout film was normal. The esophagus was successfully intubated       under direct vision. The scope was advanced to a normal major papilla in       the descending duodenum without detailed examination of the pharynx,       larynx and associated structures, and upper GI tract. The upper GI tract       was grossly normal. The minor papilla was right next to the major       papilla and appeared prominent than usual. Initial canulation resulted       in the wire being advanced towards the pacnreatic duct. The wire was       left in PD, sphinctertome was reloaded with another wire and       subesquently the bile duct was deeply cannulated with the       sphincterotome.The wire in the pancreatic duct was removed next.       Contrast was injected. I personally interpreted the bile duct images.       There was brisk flow of contrast through the ducts. Image quality was       excellent. Contrast extended to the entire biliary tree. The biliary       tree was otherwise normal. The CBD was 8-9 mm in size, the entier       biliary tree appeared unremarkable. No obvious filling defect was noted.  A wire was passed into the biliary tree. A 10 mm biliary sphincterotomy       was made with a sphincterotome using ERBE electrocautery. There was no       post-sphincterotomy bleeding. The biliary tree was swept with a 12 mm       balloon starting at the bifurcation several times. Nothing was found. Impression:               - A biliary sphincterotomy was performed.                            - The biliary tree was swept and nothing was found. Moderate Sedation:      Patient did not receive moderate sedation for this procedure, but       instead received monitored anesthesia care. Recommendation:           - Resume regular diet today, if no abdominal pain.                           - Check liver enzymes (AST, ALT, alkaline                            phosphatase, bilirubin) tomorrow.                           - Admit the patient to hospital ward for ongoing                            care. Procedure Code(s):        --- Professional ---                           [id number], Endoscopic retrograde                            cholangiopancreatography (ERCP); with                            sphincterotomy/papillotomy                           [phone], Endoscopic catheterization of the biliary                            ductal system, radiological supervision and                            interpretation Diagnosis Code(s):        --- Professional ---                           R93.2, Abnormal findings on diagnostic imaging of                            liver and biliary tract CPT copyright 2016 [employer] Association. All rights reserved. The codes documented in this report are preliminary and upon coder review may  be revised to meet current compliance requirements. Kerin Salen, MD 07/19/2017 2:40:53 PM This report  has been signed electronically. Number of Addenda: 0

## 2017-07-19 NOTE — H&P (View-Only) (Signed)
[facility] Progress Note  Olivia Gordon 31 y.o. 02/11/1986  CC:  Abnormal IOC, Choledocholithiasis   Subjective: patient had 1 episode of nausea this morning. C/O  of mild pain at incision sites. Denied abdominal distention. Denied vomiting. Denied diarrhea or constipation   Objective: Vital signs in last 24 hours: Vitals:   07/19/17 0144 07/19/17 0546  BP: 137/81 129/68  Pulse: 75 76  Resp: 16 16  Temp: 98.5 F (36.9 C) 98.7 F (37.1 C)  SpO2: 100% 98%    Physical Exam:  General:  Alert, cooperative, no distress, appears stated age  Head:  Normocephalic, without obvious abnormality, atraumatic  Eyes:  , EOM's intact,   Lungs:   Clear to auscultation bilaterally, respirations unlabored  Heart:  Regular rate and rhythm, S1, S2 normal  Abdomen:   Soft, non-tender, bowel sounds active all four quadrants,  no masses,   Extremities: Extremities normal, atraumatic, no  edema  Pulses: 2+ and symmetric    Lab Results:  Recent Labs  07/19/17 0449  NA 138  K 3.8  CL 109  CO2 24  GLUCOSE 114*  BUN <5*  CREATININE 0.60  CALCIUM 8.7*    Recent Labs  07/19/17 0449  AST 168*  ALT 164*  ALKPHOS 56  BILITOT 0.7  PROT 5.8*  ALBUMIN 3.3*   No results for input(s): WBC, NEUTROABS, HGB, HCT, MCV, PLT in the last 72 hours. No results for input(s): LABPROT, INR in the last 72 hours.    Assessment/Plan: Abnormal IOC -  Choledocholithiasis Elevated LFTs  Recommendations ------------------------- - ERCP today. Risk benefits alternatives discussed with the patient. She verbalized understanding. - Consider hepatitis panel, if persistent elevated LFTs to rule out an underlying chronic hepatitis. - Preop Zosyn. - Further plan based on ERCP findings.  Shelley Cocke MD, FACP 07/19/2017, 8:23 AM  Pager [phone]  If no answer or after 5 PM call [phone] 

## 2017-07-19 NOTE — Transfer of Care (Signed)
Immediate Anesthesia Transfer of Care Note  Patient: Miles CostainShannon D Tyner  Procedure(s) Performed: Procedure(s): ENDOSCOPIC RETROGRADE CHOLANGIOPANCREATOGRAPHY (ERCP) (Left)  Patient Location: PACU  Anesthesia Type:General  Level of Consciousness: awake, alert , oriented and patient cooperative  Airway & Oxygen Therapy: Patient Spontanous Breathing and Patient connected to face mask oxygen  Post-op Assessment: Report given to RN, Post -op Vital signs reviewed and stable and Patient moving all extremities X 4  Post vital signs: stable  Last Vitals:  Vitals:   07/19/17 1317 07/19/17 1446  BP: 133/79 (!) 149/90  Pulse: 64 80  Resp: 15 18  Temp: 36.7 C 36.5 C  SpO2: 100% 100%    Last Pain:  Vitals:   07/19/17 1446  TempSrc: Oral  PainSc:       Patients Stated Pain Goal: 3 (07/18/17 1535)  Complications: No apparent anesthesia complications

## 2017-07-19 NOTE — Anesthesia Procedure Notes (Signed)
Procedure Name: Intubation Date/Time: 07/19/2017 1:56 PM Performed by: Lissa Morales Pre-anesthesia Checklist: Patient identified, Emergency Drugs available, Suction available and Patient being monitored Patient Re-evaluated:Patient Re-evaluated prior to induction Oxygen Delivery Method: Circle system utilized Preoxygenation: Pre-oxygenation with 100% oxygen Induction Type: IV induction Ventilation: Mask ventilation without difficulty Laryngoscope Size: Mac and 3 Grade View: Grade I Tube type: Oral Tube size: 7.0 mm Number of attempts: 1 Airway Equipment and Method: Stylet and Oral airway Placement Confirmation: ETT inserted through vocal cords under direct vision,  positive ETCO2 and breath sounds checked- equal and bilateral Secured at: 21 cm Tube secured with: Tape Dental Injury: Teeth and Oropharynx as per pre-operative assessment

## 2017-07-19 NOTE — Brief Op Note (Signed)
07/18/2017 - 07/19/2017  2:41 PM  PATIENT:  Olivia Gordon  31 y.o. female  PRE-OPERATIVE DIAGNOSIS:  CBD stone  POST-OPERATIVE DIAGNOSIS:  * No post-op diagnosis entered *  PROCEDURE:  Procedure(s): ENDOSCOPIC RETROGRADE CHOLANGIOPANCREATOGRAPHY (ERCP) (Left)  SURGEON:  Surgeon(s) and Role:    * Ronnette Juniper, MD - Primary  PHYSICIAN ASSISTANT:   ASSISTANTS: none   ANESTHESIA:   MAC  EBL:  Total I/O In: 300 [I.V.:300] Out: -   BLOOD ADMINISTERED:none  DRAINS: none   LOCAL MEDICATIONS USED:  NONE  SPECIMEN:  No Specimen  DISPOSITION OF SPECIMEN:  N/A  COUNTS:  YES  TOURNIQUET:  * No tourniquets in log *  DICTATION: .Dragon Dictation  PLAN OF CARE: Admit to inpatient   PATIENT DISPOSITION:  PACU - hemodynamically stable.   Delay start of Pharmacological VTE agent (>24hrs) due to surgical blood loss or risk of bleeding: no

## 2017-07-19 NOTE — Anesthesia Preprocedure Evaluation (Addendum)
Anesthesia Evaluation  Patient identified by MRN, date of birth, ID band Patient awake    Reviewed: Allergy & Precautions, NPO status , Patient's Chart, lab work & pertinent test results  Airway Mallampati: I  TM Distance: >3 FB Neck ROM: Full    Dental  (+) Dental Advisory Given, Teeth Intact   Pulmonary neg pulmonary ROS,    Pulmonary exam normal breath sounds clear to auscultation       Cardiovascular Exercise Tolerance: Good negative cardio ROS Normal cardiovascular exam Rhythm:Regular Rate:Normal     Neuro/Psych negative neurological ROS  negative psych ROS   GI/Hepatic Neg liver ROS, Chronic cholecystitis   Endo/Other  negative endocrine ROS  Renal/GU negative Renal ROS  negative genitourinary   Musculoskeletal negative musculoskeletal ROS (+)   Abdominal   Peds  Hematology negative hematology ROS (+)   Anesthesia Other Findings   Reproductive/Obstetrics                             Anesthesia Physical  Anesthesia Plan  ASA: II  Anesthesia Plan: General   Post-op Pain Management:    Induction: Intravenous  PONV Risk Score and Plan: 4 or greater and Ondansetron, Dexamethasone, Midazolam, Scopolamine patch - Pre-op, Propofol infusion and Treatment may vary due to age or medical condition  Airway Management Planned: Oral ETT  Additional Equipment: None  Intra-op Plan:   Post-operative Plan: Extubation in OR  Informed Consent: I have reviewed the patients History and Physical, chart, labs and discussed the procedure including the risks, benefits and alternatives for the proposed anesthesia with the patient or authorized representative who has indicated his/her understanding and acceptance.   Dental advisory given  Plan Discussed with: CRNA  Anesthesia Plan Comments:         Anesthesia Quick Evaluation

## 2017-07-19 NOTE — Interval H&P Note (Signed)
History and Physical Interval Note: 31 year old female post cholecystectomy with abnormal intraoperative cholangiogram showing CBD stone. For a diagnostic ERCP, sphincterotomy and stone extraction.  07/19/2017 1:47 PM  Olivia Gordon  has presented today for ERCP, with the diagnosis of CBD stone  The various methods of treatment have been discussed with the patient and family. After consideration of risks, benefits and other options for treatment, the patient has consented to  Procedure(s): ENDOSCOPIC RETROGRADE CHOLANGIOPANCREATOGRAPHY (ERCP) (Left) as a surgical intervention .  The patient's history has been reviewed, patient examined, no change in status, stable for surgery.  I have reviewed the patient's chart and labs.  Questions were answered to the patient's satisfaction.     Kerin SalenArya Tasharra Nodine

## 2017-07-19 NOTE — Op Note (Signed)
ERCP was performed for filling defect noted on intraoperative cholangiogram.\   Findings: Prominent minor papillary right next to the major papula. After initial cannulation, wire was noted in the direction of pancreatic duct. The wire was left there, sphinctertome was then reloaded with another wire, sequentially CBD was deeply cannulated. The wire in the pancreatic duct was then removed. The pancreatic duct was never injected during the procedure.  Cholangiogram revealed a normal CBD of 8-9 mm without any obvious filling defect. The entire biliary tree appeared unremarkable, without any evident intra-or extrahepatic dilatation. A 10 mm sphincterotomy was performed. A 12 mm balloon was used to sweep the CBD starting at the bifurcation. No stone or sludge was retrieved. Balloon sweep was performed a few more times, there was no evidence of any stone or sludge noted in CBD.  The scope was then withdrawn and the procedure was terminated.   Recommend regular diet if patient does not have any abdominal pain. LFTs in a.m.Marland Kitchen.   Kerin SalenArya Ayomide Purdy, M.D. [phone]

## 2017-07-20 DIAGNOSIS — K8064 Calculus of gallbladder and bile duct with chronic cholecystitis without obstruction: Secondary | ICD-10-CM | POA: Diagnosis not present

## 2017-07-20 LAB — COMPREHENSIVE METABOLIC PANEL
ALT: 101 U/L — AB (ref 14–54)
ANION GAP: 8 (ref 5–15)
AST: 43 U/L — ABNORMAL HIGH (ref 15–41)
Albumin: 3.2 g/dL — ABNORMAL LOW (ref 3.5–5.0)
Alkaline Phosphatase: 47 U/L (ref 38–126)
BUN: 7 mg/dL (ref 6–20)
CO2: 24 mmol/L (ref 22–32)
Calcium: 8.7 mg/dL — ABNORMAL LOW (ref 8.9–10.3)
Chloride: 107 mmol/L (ref 101–111)
Creatinine, Ser: 0.61 mg/dL (ref 0.44–1.00)
GFR calc Af Amer: >60 mL/min (ref >60)
GFR calc non Af Amer: >60 mL/min (ref >60)
GLUCOSE: 91 mg/dL (ref 65–99)
POTASSIUM: 3.6 mmol/L (ref 3.5–5.1)
SODIUM: 139 mmol/L (ref 135–145)
TOTAL PROTEIN: 5.7 g/dL — AB (ref 6.5–8.1)
Total Bilirubin: 0.6 mg/dL (ref 0.3–1.2)

## 2017-07-20 MED ORDER — HYDROCODONE-ACETAMINOPHEN 5-325 MG PO TABS: 1.0000 | ORAL_TABLET | ORAL | 0 refills | Status: DC | PRN

## 2017-07-20 NOTE — Discharge Summary (Signed)
Physician Discharge Summary [facility]  Patient ID: Olivia CostainShannon D Gordon MRN: [id number] DOB/AGE: 1985/11/23 31 y.o.  Admit date: 07/18/2017 Discharge date: 07/20/2017  Admission Diagnoses:  Chronic cholecystitis, cholelithiasis  Discharge Diagnoses:  Principal Problem:   Cholelithiasis with chronic cholecystitis Active Problems:   History of pancreatitis   Discharged Condition: good  Hospital Course: Patient was admitted for observation following gallbladder surgery.  Patient noted with distal CBD stone on IOC.  Patient seen by GI and underwent ERCP on POD#1.  Post op course was uncomplicated.  Pain was well controlled.  Tolerated diet.  Patient was prepared for discharge home on POD#2.  Consults: GI  Treatments: surgery: lap chole with IOC, ERCP  Discharge Exam: Blood pressure 130/77, pulse 70, temperature 98.6 F (37 C), temperature source Oral, resp. rate 18, height 5\' 5"  (1.651 m), weight 70.3 kg (155 lb), SpO2 100 %. HEENT - clear Neck - soft Chest - clear bilaterally Cor - RRR Abd - soft without distension; dressings dry and intact  Disposition: Home  Discharge Instructions    Diet - low sodium heart healthy    Complete by:  As directed    Discharge instructions    Complete by:  As directed    [facility]  LAPAROSCOPIC SURGERY:  POST-OP INSTRUCTIONS  Always review your discharge instruction sheet given to you by the facility where your surgery was performed.  A prescription for pain medication may be given to you upon discharge.  Take your pain medication as prescribed.  If narcotic pain medicine is not needed, then you may take acetaminophen (Tylenol) or ibuprofen (Advil) as needed.  Take your usually prescribed medications unless otherwise directed.  If you need a refill on your pain medication, please contact your pharmacy.  They will contact our office to request authorization. Prescriptions will not be filled after 5 P.M.  or on weekends.  You should follow a light diet the first few days after arrival home, such as soup and crackers or toast.  Be sure to include plenty of fluids daily.  Most patients will experience some swelling and bruising in the area of the incisions.  Ice packs will help.  Swelling and bruising can take several days to resolve.   It is common to experience some constipation after surgery.  Increasing fluid intake and taking a stool softener (such as Colace) will usually help or prevent this problem from occurring.  A mild laxative (Milk of Magnesia or Miralax) should be taken according to package instructions if there has been no bowel movement after 48 hours.  You will have steri-strips and a gauze dressing over your incisions.  You may remove the gauze bandage on the second day after surgery, and you may shower at that time.  Leave your steri-strips (small skin tapes) in place directly over the incision.  These strips should remain on the skin for 5-7 days and then be removed.  You may get them wet in the shower and pat them dry.  Any sutures or staples will be removed at the office during your follow-up visit.  ACTIVITIES:  You may resume regular (light) daily activities beginning the next day - such as daily self-care, walking, climbing stairs - gradually increasing activities as tolerated.  You may have sexual intercourse when it is comfortable.  Refrain from any heavy lifting or straining until approved by your doctor.  You may drive when you are no longer taking prescription pain medication, you can comfortably wear  a seatbelt, and you can safely maneuver your car and apply brakes.  You should see your doctor in the office for a follow-up appointment approximately 2-3 weeks after your surgery.  Make sure that you call for this appointment within a day or two after you arrive home to insure a convenient appointment time.  WHEN TO CALL YOUR DOCTOR: Fever over 101.0 Inability to  urinate Continued bleeding from incision Increased pain, redness, or drainage from the incision Increasing abdominal pain  The clinic staff is available to answer your questions during regular business hours.  Please don't hesitate to call and ask to speak to one of the nurses for clinical concerns.  If you have a medical emergency, go to the nearest emergency room or call 911.  A surgeon from [facility] is always on call for the hospital.  Velora Heckler, MD, [facility] Office: [phone] Toll Free:  [phone] FAX [phone]  Website: [email or web address]   Discharge wound care:    Complete by:  As directed    Remove gauze dressings today and shower.  Leave steristrips 5 days.   Increase activity slowly    Complete by:  As directed      Allergies as of 07/20/2017   No Known Allergies     Medication List    TAKE these medications   acetaminophen 500 MG tablet Commonly known as:  TYLENOL Take 1,000 mg by mouth daily as needed for moderate pain or headache.   HYDROcodone-acetaminophen 5-325 MG tablet Commonly known as:  NORCO/VICODIN Take 1-2 tablets by mouth every 4 (four) hours as needed for moderate pain.   MIRENA (52 MG) 20 MCG/24HR IUD Generic drug:  levonorgestrel Mirena 20 mcg/24 hr (5 years) intrauterine device   ondansetron 4 MG disintegrating tablet Commonly known as:  ZOFRAN ODT Take 1 tablet (4 mg total) by mouth every 8 (eight) hours as needed for nausea or vomiting.   oxyCODONE-acetaminophen 5-325 MG tablet Commonly known as:  PERCOCET/ROXICET Take 1 tablet by mouth every 6 (six) hours as needed for severe pain.            Discharge Care Instructions        Start     Ordered   07/20/17 0000  Diet - low sodium heart healthy     07/20/17 0753   07/20/17 0000  Increase activity slowly     07/20/17 0753   07/20/17 0000  Discharge instructions    Comments:  [facility]  LAPAROSCOPIC SURGERY:  POST-OP INSTRUCTIONS  Always review your discharge instruction sheet given to you by the facility where your surgery was performed.  A prescription for pain medication may be given to you upon discharge.  Take your pain medication as prescribed.  If narcotic pain medicine is not needed, then you may take acetaminophen (Tylenol) or ibuprofen (Advil) as needed.  Take your usually prescribed medications unless otherwise directed.  If you need a refill on your pain medication, please contact your pharmacy.  They will contact our office to request authorization. Prescriptions will not be filled after 5 P.M. or on weekends.  You should follow a light diet the first few days after arrival home, such as soup and crackers or toast.  Be sure to include plenty of fluids daily.  Most patients will experience some swelling and bruising in the area of the incisions.  Ice packs will help.  Swelling and bruising can take several days to resolve.  It is common to experience some constipation after surgery.  Increasing fluid intake and taking a stool softener (such as Colace) will usually help or prevent this problem from occurring.  A mild laxative (Milk of Magnesia or Miralax) should be taken according to package instructions if there has been no bowel movement after 48 hours.  You will have steri-strips and a gauze dressing over your incisions.  You may remove the gauze bandage on the second day after surgery, and you may shower at that time.  Leave your steri-strips (small skin tapes) in place directly over the incision.  These strips should remain on the skin for 5-7 days and then be removed.  You may get them wet in the shower and pat them dry.  Any sutures or staples will be removed at the office during your follow-up visit.  ACTIVITIES:  You may resume regular (light) daily activities beginning the next day - such as daily self-care, walking, climbing stairs - gradually  increasing activities as tolerated.  You may have sexual intercourse when it is comfortable.  Refrain from any heavy lifting or straining until approved by your doctor.  You may drive when you are no longer taking prescription pain medication, you can comfortably wear a seatbelt, and you can safely maneuver your car and apply brakes.  You should see your doctor in the office for a follow-up appointment approximately 2-3 weeks after your surgery.  Make sure that you call for this appointment within a day or two after you arrive home to insure a convenient appointment time.  WHEN TO CALL YOUR DOCTOR: Fever over 101.0 Inability to urinate Continued bleeding from incision Increased pain, redness, or drainage from the incision Increasing abdominal pain  The clinic staff is available to answer your questions during regular business hours.  Please don't hesitate to call and ask to speak to one of the nurses for clinical concerns.  If you have a medical emergency, go to the nearest emergency room or call 911.  A surgeon from [facility] is always on call for the hospital.  Velora Heckler, MD, [facility] Office: [phone] Toll Free:  [phone] FAX [phone]  Website: [email or web address]   07/20/17 0753   07/20/17 0000  Discharge wound care:    Comments:  Remove gauze dressings today and shower.  Leave steristrips 5 days.   07/20/17 0753   07/20/17 0000  HYDROcodone-acetaminophen (NORCO/VICODIN) 5-325 MG tablet  Every 4 hours PRN     07/20/17 0753     Follow-up Information    Darnell Level, MD. Schedule an appointment as soon as possible for a visit in 3 week(s).   Specialty:  General Surgery Contact information: [address] [place] [address] [phone]           Velora Heckler, MD, [facility] Office: [phone]   Signed: Velora Heckler 07/20/2017, 7:54 AM

## 2017-07-20 NOTE — Care Management Note (Signed)
Case Management Note  Patient Details  Name: Olivia Gordon MRN: [id number] Date of Birth: 1986-10-30  Subjective/Objective:                 Patient with order to DC to home today. Chart reviewed. No Home Health or Equipment needs, no unacknowledged Case Management consults or medication needs identified at the time of this note. Plan for DC to home. If needs arise today prior to discharge, please call Lawerance SabalDebbie Koula Venier RN CM at [phone].    Action/Plan:   Expected Discharge Date:  07/20/17               Expected Discharge Plan:  Home/Self Care  In-House Referral:     Discharge planning Services  CM Consult  Post Acute Care Choice:    Choice offered to:     DME Arranged:    DME Agency:     HH Arranged:    HH Agency:     Status of Service:  Completed, signed off  If discussed at [employer] Length of Stay Meetings, dates discussed:    Additional Comments:  Lawerance SabalDebbie Tanishka Drolet, RN 07/20/2017, 8:13 AM

## 2017-07-20 NOTE — Progress Notes (Signed)
Patient is alert and oriented, vital signs are stable, incision is within normal limits, patient is tolerating her diet no nausea or vomiting, discharge instructions reviewed with patient and spouse, patient to follow up with [facility] within 3 weeks, prescription given, questions and concerns answered Merry ProudBracey, Donn Wilmot N Rn 9:44 AM 07-20-2017

## 2017-07-22 ENCOUNTER — Encounter (HOSPITAL_COMMUNITY): Payer: Self-pay | Admitting: Gastroenterology

## 2017-07-22 NOTE — Anesthesia Postprocedure Evaluation (Signed)
Anesthesia Post Note  Patient: Olivia Gordon  Procedure(s) Performed: Procedure(s) (LRB): ENDOSCOPIC RETROGRADE CHOLANGIOPANCREATOGRAPHY (ERCP) (Left)     Patient location during evaluation: PACU Anesthesia Type: General Level of consciousness: awake and alert Pain management: pain level controlled Vital Signs Assessment: post-procedure vital signs reviewed and stable Respiratory status: spontaneous breathing, nonlabored ventilation and respiratory function stable Cardiovascular status: blood pressure returned to baseline and stable Postop Assessment: no signs of nausea or vomiting Anesthetic complications: no    Last Vitals:  Vitals:   07/20/17 0211 07/20/17 0520  BP: 138/82 130/77  Pulse: 65 70  Resp: 18 18  Temp: 36.9 C 37 C  SpO2: 99% 100%    Last Pain:  Vitals:   07/20/17 0924  TempSrc:   PainSc: 0-No pain                 Beryle Lathehomas E Brock

## 2017-07-29 ENCOUNTER — Emergency Department (HOSPITAL_COMMUNITY)
Admission: EM | Admit: 2017-07-29 | Discharge: 2017-07-30 | Disposition: A | Discharge status: Home / Self Care | Payer: BC Managed Care – PPO | Attending: Emergency Medicine | Admitting: Emergency Medicine

## 2017-07-29 ENCOUNTER — Emergency Department (HOSPITAL_COMMUNITY): Payer: BC Managed Care – PPO

## 2017-07-29 ENCOUNTER — Encounter (HOSPITAL_COMMUNITY): Payer: Self-pay

## 2017-07-29 DIAGNOSIS — G43A0 Cyclical vomiting, not intractable: Secondary | ICD-10-CM | POA: Diagnosis not present

## 2017-07-29 DIAGNOSIS — R1011 Right upper quadrant pain: Secondary | ICD-10-CM | POA: Insufficient documentation

## 2017-07-29 DIAGNOSIS — R1013 Epigastric pain: Secondary | ICD-10-CM | POA: Insufficient documentation

## 2017-07-29 DIAGNOSIS — R1012 Left upper quadrant pain: Secondary | ICD-10-CM | POA: Diagnosis not present

## 2017-07-29 DIAGNOSIS — Z79899 Other long term (current) drug therapy: Secondary | ICD-10-CM | POA: Insufficient documentation

## 2017-07-29 DIAGNOSIS — R1115 Cyclical vomiting syndrome unrelated to migraine: Secondary | ICD-10-CM

## 2017-07-29 HISTORY — DX: Acute pancreatitis without necrosis or infection, unspecified: K85.90

## 2017-07-29 LAB — COMPREHENSIVE METABOLIC PANEL
ALT: 30 U/L (ref 14–54)
AST: 23 U/L (ref 15–41)
Albumin: 4.4 g/dL (ref 3.5–5.0)
Alkaline Phosphatase: 65 U/L (ref 38–126)
Anion gap: 12 (ref 5–15)
BILIRUBIN TOTAL: 0.6 mg/dL (ref 0.3–1.2)
BUN: 7 mg/dL (ref 6–20)
CHLORIDE: 107 mmol/L (ref 101–111)
CO2: 21 mmol/L — ABNORMAL LOW (ref 22–32)
CREATININE: 0.78 mg/dL (ref 0.44–1.00)
Calcium: 9.5 mg/dL (ref 8.9–10.3)
Glucose, Bld: 132 mg/dL — ABNORMAL HIGH (ref 65–99)
POTASSIUM: 3.9 mmol/L (ref 3.5–5.1)
Sodium: 140 mmol/L (ref 135–145)
TOTAL PROTEIN: 7.8 g/dL (ref 6.5–8.1)

## 2017-07-29 LAB — LIPASE, BLOOD: LIPASE: 17 U/L (ref 11–51)

## 2017-07-29 LAB — CBC
HEMATOCRIT: 43.5 % (ref 36.0–46.0)
Hemoglobin: 14.7 g/dL (ref 12.0–15.0)
MCH: 31.7 pg (ref 26.0–34.0)
MCHC: 33.8 g/dL (ref 30.0–36.0)
MCV: 94 fL (ref 78.0–100.0)
PLATELETS: 274 10*3/uL (ref 150–400)
RBC: 4.63 MIL/uL (ref 3.87–5.11)
RDW: 13.3 % (ref 11.5–15.5)
WBC: 6.9 10*3/uL (ref 4.0–10.5)

## 2017-07-29 LAB — URINALYSIS, ROUTINE W REFLEX MICROSCOPIC
BILIRUBIN URINE: NEGATIVE
Bacteria, UA: NONE SEEN
GLUCOSE, UA: NEGATIVE mg/dL
HGB URINE DIPSTICK: NEGATIVE
KETONES UR: 80 mg/dL — AB
LEUKOCYTES UA: NEGATIVE
NITRITE: NEGATIVE
PH: 8 (ref 5.0–8.0)
Protein, ur: 100 mg/dL — AB
SPECIFIC GRAVITY, URINE: 1.025 (ref 1.005–1.030)

## 2017-07-29 LAB — I-STAT BETA HCG BLOOD, ED (MC, WL, AP ONLY): I-stat hCG, quantitative: <5 m[IU]/mL (ref <5)

## 2017-07-29 MED ORDER — ONDANSETRON HCL 4 MG/2ML IJ SOLN
4.0000 mg | Freq: Once | INTRAMUSCULAR | Status: AC
  Administered 2017-07-29: 4 mg via INTRAVENOUS
  Filled 2017-07-29: qty 2

## 2017-07-29 MED ORDER — METOCLOPRAMIDE HCL 5 MG/ML IJ SOLN
10.0000 mg | Freq: Once | INTRAMUSCULAR | Status: DC
  Filled 2017-07-29: qty 2

## 2017-07-29 MED ORDER — CAPSAICIN 0.025 % EX CREA
TOPICAL_CREAM | Freq: Once | CUTANEOUS | Status: AC
  Administered 2017-07-29: 22:00:00 via TOPICAL
  Filled 2017-07-29: qty 60

## 2017-07-29 MED ORDER — SODIUM CHLORIDE 0.9 % IV BOLUS (SEPSIS): 1000.0000 mL | Freq: Once | INTRAVENOUS | Status: DC

## 2017-07-29 MED ORDER — HYDROMORPHONE HCL 1 MG/ML IJ SOLN
0.5000 mg | Freq: Once | INTRAMUSCULAR | Status: AC
  Administered 2017-07-29: 0.5 mg via INTRAVENOUS
  Filled 2017-07-29: qty 1

## 2017-07-29 MED ORDER — SODIUM CHLORIDE 0.9 % IV BOLUS (SEPSIS)
1000.0000 mL | Freq: Once | INTRAVENOUS | Status: AC
  Administered 2017-07-29: 1000 mL via INTRAVENOUS

## 2017-07-29 MED ORDER — IOPAMIDOL (ISOVUE-300) INJECTION 61%
INTRAVENOUS | Status: AC
  Filled 2017-07-29: qty 100

## 2017-07-29 MED ORDER — ONDANSETRON 4 MG PO TBDP
4.0000 mg | ORAL_TABLET | Freq: Once | ORAL | Status: AC | PRN
  Administered 2017-07-29: 4 mg via ORAL
  Filled 2017-07-29: qty 1

## 2017-07-29 MED ORDER — IOPAMIDOL (ISOVUE-300) INJECTION 61%
100.0000 mL | Freq: Once | INTRAVENOUS | Status: AC | PRN
  Administered 2017-07-29: 100 mL via INTRAVENOUS

## 2017-07-29 NOTE — ED Triage Notes (Signed)
Patient had a cholecystectomy on 07/18/17. No problem after surgery. Today patient had chips, humus, and coffee and began having multiple episodes of vomiting.

## 2017-07-29 NOTE — ED Notes (Signed)
Pt reports n/v today, denies diarrhea and abd pain at the time.  Pt reports vomiting more than 20 times today, unable to eat or drink d/t nausea.

## 2017-07-29 NOTE — ED Provider Notes (Signed)
[facility]-EMERGENCY DEPT Provider Note   CSN: [id number] Arrival date & time: 07/29/17  1650     History   Chief Complaint Chief Complaint  Patient presents with  . Abdominal Pain  . Emesis    HPI Olivia Gordon is a 31 y.o. female.  The history is provided by the patient.  Abdominal Pain   This is a recurrent problem. The current episode started 6 to 12 hours ago. The problem occurs constantly. Progression since onset: fluctuating. The pain is associated with an unknown factor. The pain is located in the epigastric region, RUQ and LUQ. The quality of the pain is cramping. The pain is severe. Associated symptoms include nausea and vomiting. Pertinent negatives include diarrhea.  Emesis   Associated symptoms include abdominal pain. Pertinent negatives include no diarrhea.   07/18/17 cholecystectomy and ERCP  Past Medical History:  Diagnosis Date  . Pancreatitis     Patient Active Problem List   Diagnosis Date Noted  . Cholelithiasis with chronic cholecystitis 07/11/2017  . History of pancreatitis 07/11/2017    Past Surgical History:  Procedure Laterality Date  . CHOLECYSTECTOMY N/A 07/18/2017   Procedure: LAPAROSCOPIC CHOLECYSTECTOMY WITH INTRAOPERATIVE CHOLANGIOGRAM;  Surgeon: Darnell Level, MD;  Location: [facility];  Service: General;  Laterality: N/A;  . ERCP Left 07/19/2017   Procedure: ENDOSCOPIC RETROGRADE CHOLANGIOPANCREATOGRAPHY (ERCP);  Surgeon: Kerin Salen, MD;  Location: [facility];  Service: Gastroenterology;  Laterality: Left;    OB History    No data available       Home Medications    Prior to Admission medications   Medication Sig Start Date End Date Taking? Authorizing Provider  HYDROcodone-acetaminophen (NORCO/VICODIN) 5-325 MG tablet Take 1-2 tablets by mouth every 4 (four) hours as needed for moderate pain. 07/20/17   Darnell Level, MD  levonorgestrel (MIRENA, 52 MG,) 20 MCG/24HR IUD Mirena 20 mcg/24 hr (5 years) intrauterine device    [provider]  metoCLOPramide (REGLAN) 10 MG tablet Take 1 tablet (10 mg total) by mouth every 6 (six) hours as needed for nausea. 07/30/17   Nira Conn, MD  ondansetron (ZOFRAN ODT) 4 MG disintegrating tablet Take 1 tablet (4 mg total) by mouth every 8 (eight) hours as needed for nausea or vomiting. Patient not taking: Reported on 07/04/2017 06/21/17   Audry Pili, PA-C  oxyCODONE-acetaminophen (PERCOCET/ROXICET) 5-325 MG tablet Take 1 tablet by mouth every 6 (six) hours as needed for severe pain. Patient not taking: Reported on 07/04/2017 06/21/17   Audry Pili, PA-C  promethazine (PHENERGAN) 25 MG suppository Place 1 suppository rectally 3 (three) times daily as needed. 07/29/17   [provider]    Family History No family history on file.  Social History Social History  Substance Use Topics  . Smoking status: Never Smoker  . Smokeless tobacco: Never Used  . Alcohol use No     Allergies   Patient has no known allergies.   Review of Systems Review of Systems  Gastrointestinal: Positive for abdominal pain, nausea and vomiting. Negative for diarrhea.   All other systems are reviewed and are negative for acute change except as noted in the HPI   Physical Exam Updated Vital Signs BP 120/80 (BP Location: Left Arm)   Pulse 72   Temp 97.6 F (36.4 C) (Oral)   Resp 20   Ht  (1.651 m)   Wt 70.3 kg (155 lb)   SpO2 100%   BMI 25.79 kg/m   Physical Exam  Constitutional: She is oriented  to person, place, and time. She appears well-developed and well-nourished. No distress.  HENT:  Head: Normocephalic and atraumatic.  Nose: Nose normal.  Eyes: Pupils are equal, round, and reactive to light. Conjunctivae and EOM are normal. Right eye exhibits no discharge. Left eye exhibits no discharge. No scleral icterus.  Neck: Normal range of motion. Neck supple.  Cardiovascular: Normal rate and regular rhythm.  Exam reveals no gallop and no friction rub.   No murmur  heard. Pulmonary/Chest: Effort normal and breath sounds normal. No stridor. No respiratory distress. She has no rales.  Abdominal: Soft. She exhibits no distension. There is tenderness in the right upper quadrant, epigastric area and left upper quadrant. There is no rigidity, no rebound and no guarding.  Musculoskeletal: She exhibits no edema or tenderness.  Neurological: She is alert and oriented to person, place, and time.  Skin: Skin is warm and dry. No rash noted. She is not diaphoretic. No erythema.  Psychiatric: She has a normal mood and affect.  Vitals reviewed.    ED Treatments / Results  Labs (all labs ordered are listed, but only abnormal results are displayed) Labs Reviewed  COMPREHENSIVE METABOLIC PANEL - Abnormal; Notable for the following:       Result Value   CO2 21 (*)    Glucose, Bld 132 (*)    All other components within normal limits  URINALYSIS, ROUTINE W REFLEX MICROSCOPIC - Abnormal; Notable for the following:    APPearance HAZY (*)    Ketones, ur 80 (*)    Protein, ur 100 (*)    Squamous Epithelial / LPF 6-30 (*)    All other components within normal limits  LIPASE, BLOOD  CBC  I-STAT BETA HCG BLOOD, ED ([facility], [facility], [facility] ONLY)    EKG  EKG Interpretation None       Radiology Ct Abdomen Pelvis W Contrast  Result Date: 07/29/2017 CLINICAL DATA:  Nausea and vomiting today. Unable to eat or drink. Recent cholecystectomy on 07/18/2017 EXAM: CT ABDOMEN AND PELVIS WITH CONTRAST TECHNIQUE: Multidetector CT imaging of the abdomen and pelvis was performed using the standard protocol following bolus administration of intravenous contrast. CONTRAST:  ISOVUE-300 IOPAMIDOL (ISOVUE-300) INJECTION 61% COMPARISON:  None. FINDINGS: Lower chest: Lung bases are clear. Hepatobiliary: No focal liver abnormality is seen. Status post cholecystectomy. No biliary dilatation. Pancreas: Unremarkable. No pancreatic ductal dilatation or surrounding inflammatory changes. Spleen:  Normal in size without focal abnormality. Adrenals/Urinary Tract: Adrenal glands are unremarkable. Kidneys are normal, without renal calculi, focal lesion, or hydronephrosis. Bladder is unremarkable. Stomach/Bowel: Stomach is within normal limits. Appendix appears normal. No evidence of bowel wall thickening, distention, or inflammatory changes. Vascular/Lymphatic: No significant vascular findings are present. No enlarged abdominal or pelvic lymph nodes. Reproductive: An intrauterine device appears appropriately positioned. Uterus and ovaries are not enlarged. Small amount of free fluid in the pelvis is likely physiologic. Probable involuting cyst in the left ovary. Other: Small periumbilical hernias containing fat. No free air or free fluid in the abdomen. Musculoskeletal: No acute or significant osseous findings. IMPRESSION: 1. No acute process demonstrated in the abdomen or pelvis. No evidence of bowel obstruction or inflammation. 2. Intrauterine device. 3. Small periumbilical hernias containing fat. Electronically Signed   By: Burman Nieves M.D.   On: 07/29/2017 23:28    Procedures Procedures (including critical care time)  Medications Ordered in ED Medications  metoCLOPramide (REGLAN) injection 10 mg (not administered)  sodium chloride 0.9 % bolus 1,000 mL (not administered)  iopamidol (ISOVUE-300)  61 % injection (not administered)  ondansetron (ZOFRAN-ODT) disintegrating tablet 4 mg (4 mg Oral Given 07/29/17 1850)  sodium chloride 0.9 % bolus 1,000 mL (0 mLs Intravenous Stopped 07/29/17 2249)  ondansetron (ZOFRAN) injection 4 mg (4 mg Intravenous Given 07/29/17 2133)  HYDROmorphone (DILAUDID) injection 0.5 mg (0.5 mg Intravenous Given 07/29/17 2249)  capsaicin (ZOSTRIX) 0.025 % cream ( Topical Given 07/29/17 2209)  iopamidol (ISOVUE-300) 61 % injection 100 mL (100 mLs Intravenous Contrast Given 07/29/17 2315)     Initial Impression / Assessment and Plan / ED Course  I have reviewed the triage  vital signs and the nursing notes.  Pertinent labs & imaging results that were available during my care of the patient were reviewed by me and considered in my medical decision making (see chart for details).     Patient here with epigastric pain and cyclical vomiting in the setting of recent cholecystectomy. CT scan without evidence of obstruction or postoperative complication. Labs grossly reassuring.  Given patient's history of marijuana use, considered cannabinoid hyperemesis syndrome. Capsaicin applied but had minimal improvement in her symptomatology.  Patient given IV fluids, nausea medication and pain medication. Complete resolution of her symptomatology, mostly following the Reglan. Discussed the possibility of non-diabetic gastroparesis. Recommended close follow-up with primary care provider for GI referral if needed. Recommended cessation of marijuana use.  The patient is safe for discharge with strict return precautions.   Final Clinical Impressions(s) / ED Diagnoses   Final diagnoses:  Epigastric abdominal pain  Non-intractable cyclical vomiting with nausea   Disposition: Discharge  Condition: Good  I have discussed the results, Dx and Tx plan with the patient who expressed understanding and agree(s) with the plan. Discharge instructions discussed at great length. The patient was given strict return precautions who verbalized understanding of the instructions. No further questions at time of discharge.    New Prescriptions   METOCLOPRAMIDE (REGLAN) 10 MG TABLET    Take 1 tablet (10 mg total) by mouth every 6 (six) hours as needed for nausea.    Follow Up: primary care provider  Schedule an appointment as soon as possible for a visit  in 5-7 days, If symptoms do not improve or  worsen      Cardama, Amadeo Garnet, MD 07/30/17 0010

## 2017-07-29 NOTE — ED Notes (Signed)
Pt is aware of need for urine specimen. 

## 2017-07-30 MED ORDER — METOCLOPRAMIDE HCL 10 MG PO TABS: 10.0000 mg | ORAL_TABLET | Freq: Four times a day (QID) | ORAL | 0 refills | Status: DC | PRN

## 2017-11-28 ENCOUNTER — Other Ambulatory Visit: Payer: Self-pay | Admitting: Obstetrics and Gynecology

## 2018-10-27 IMAGING — RF DG CHOLANGIOGRAM OPERATIVE
1 series · 15 of 16 positions shown · non-contrast
Comparison: Ultrasound 06/21/2017

CLINICAL DATA: Cholecystitis.

EXAM:
INTRAOPERATIVE CHOLANGIOGRAM
TECHNIQUE: Cholangiographic images from the C-arm fluoroscopic device were
submitted for interpretation post-operatively. Please see the
procedural report for the amount of contrast and the fluoroscopy
time utilized.

[Series 1: run · 4 acquisitions, 15 frames shown]
[im 1/4]
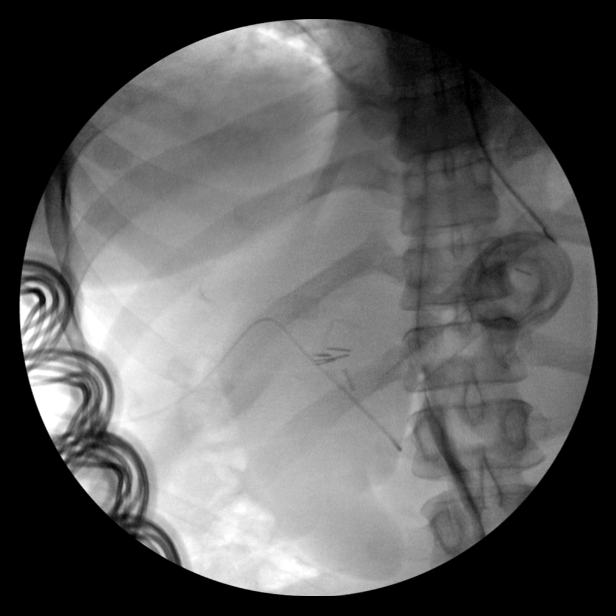
[im 1/4]
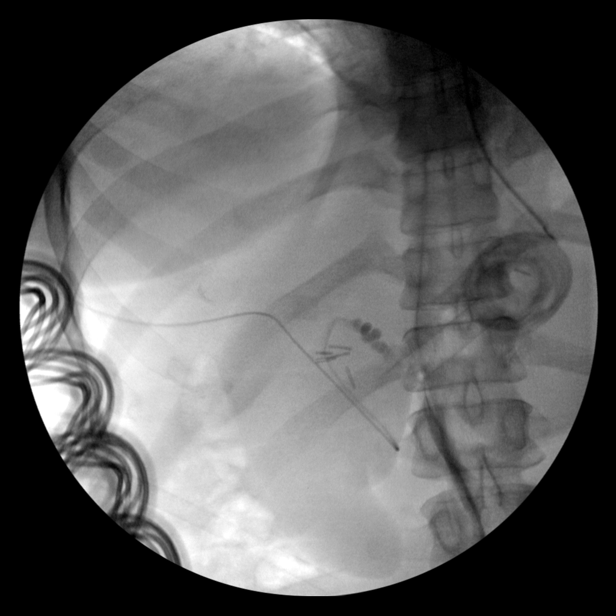
[im 1/4]
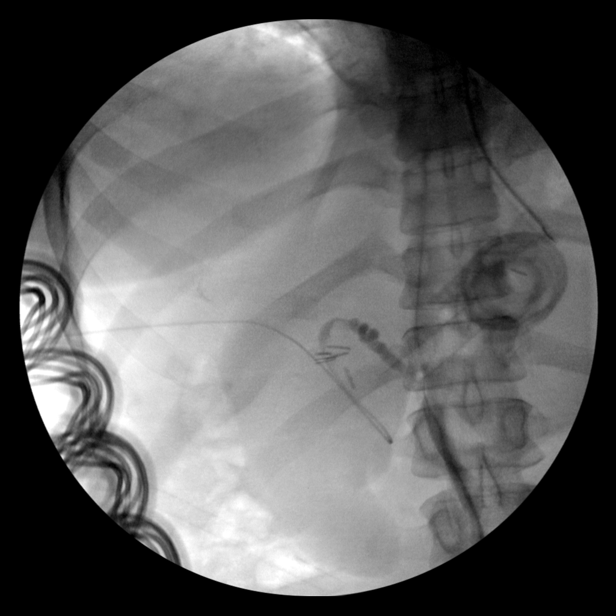
[im 1/4]
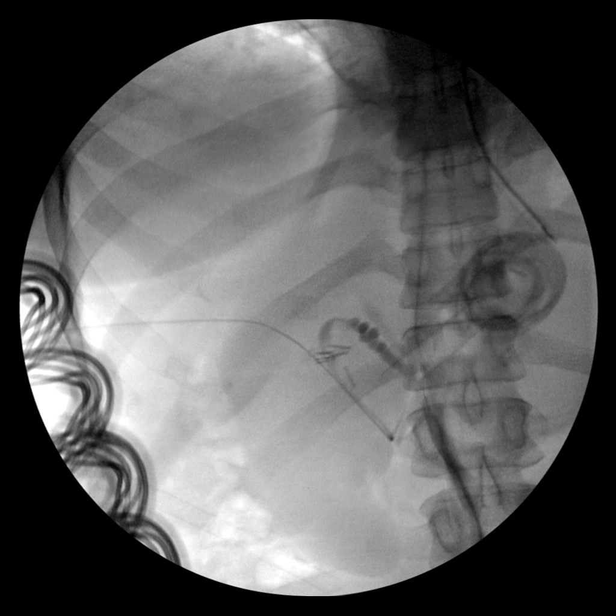
[im 2/4]
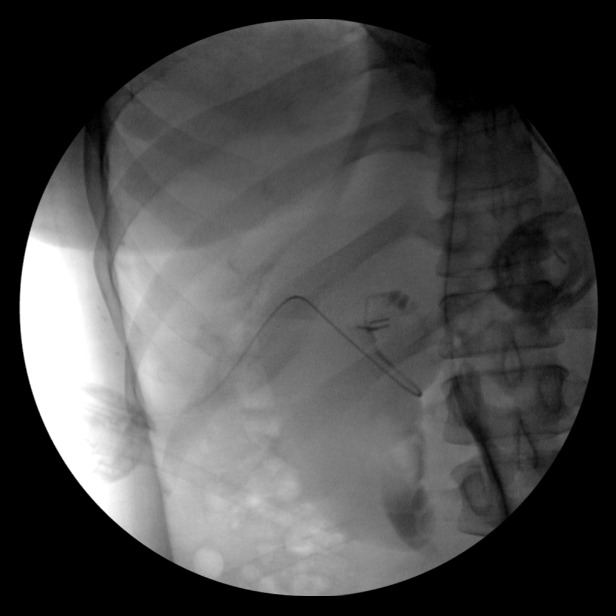
[im 2/4]
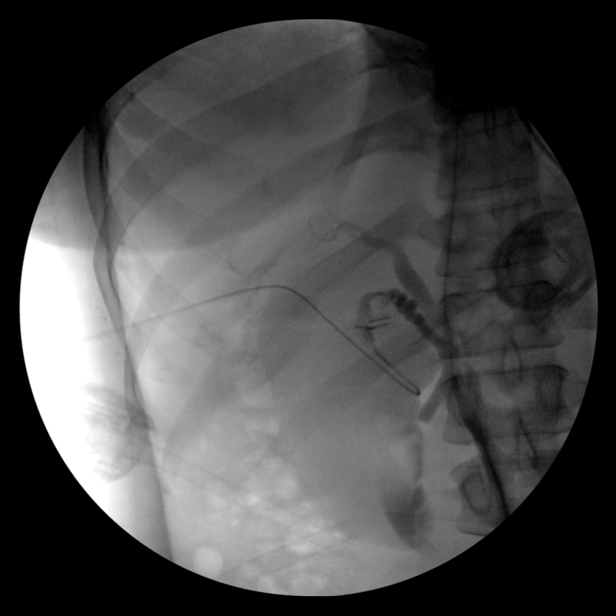
[im 2/4]
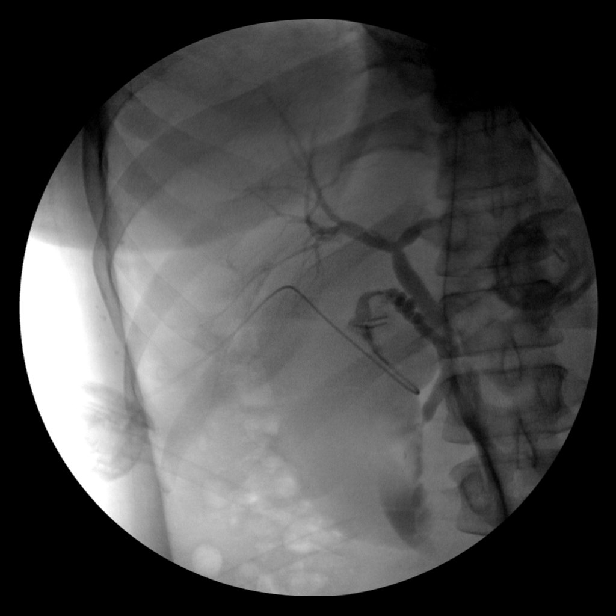
[im 3/4]
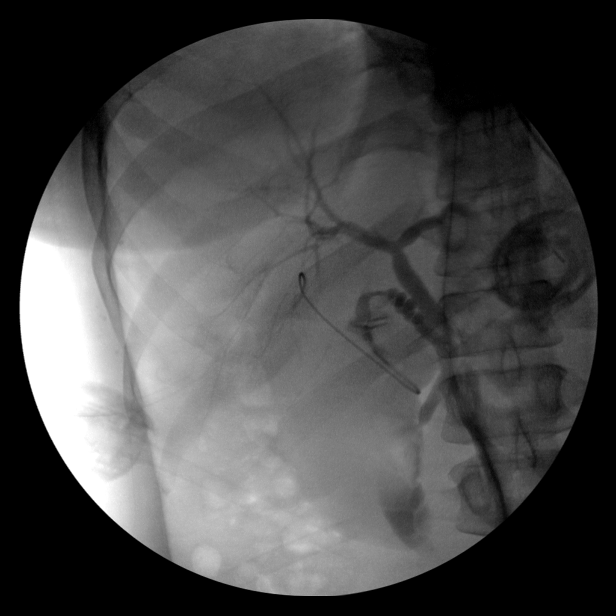
[im 3/4]
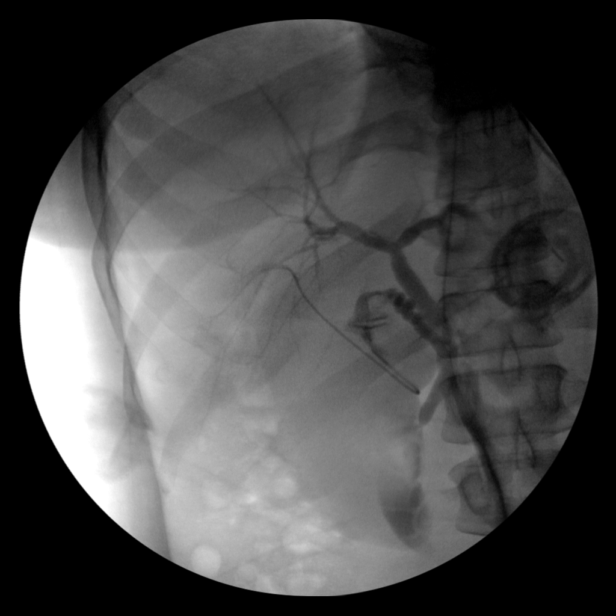
[im 3/4]
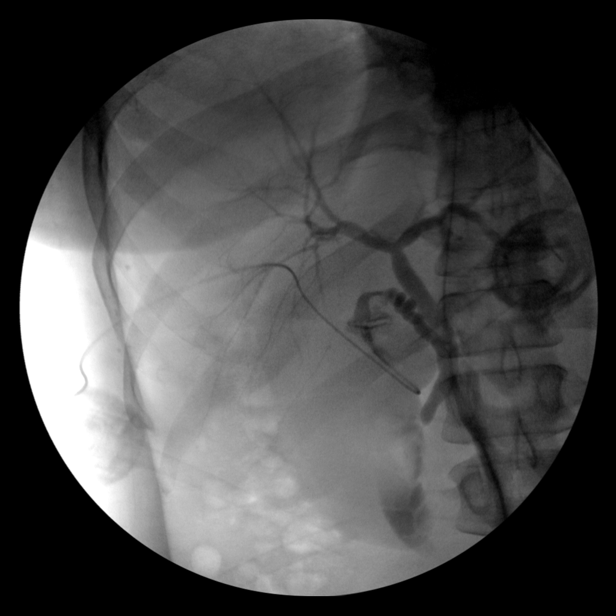
[im 3/4]
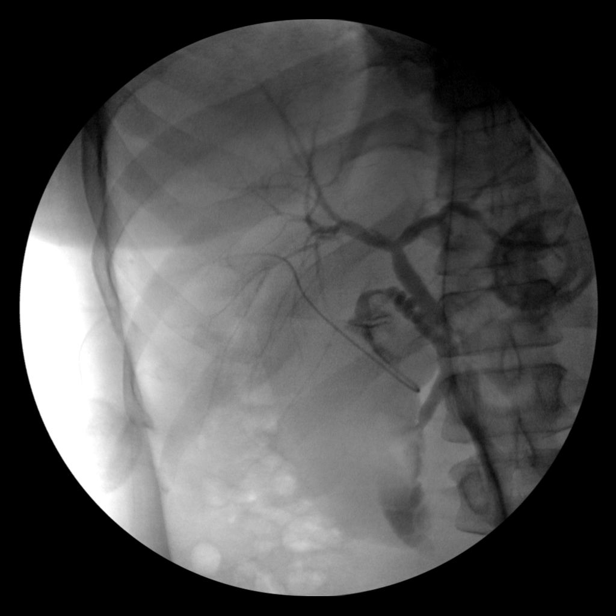
[im 4/4]
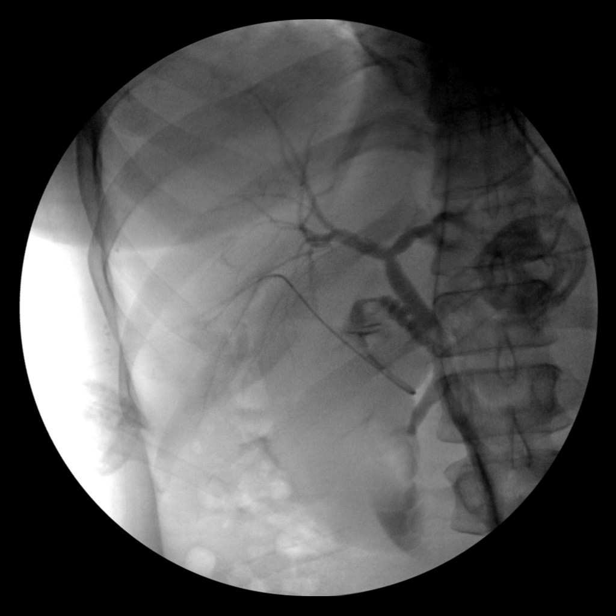
[im 4/4]
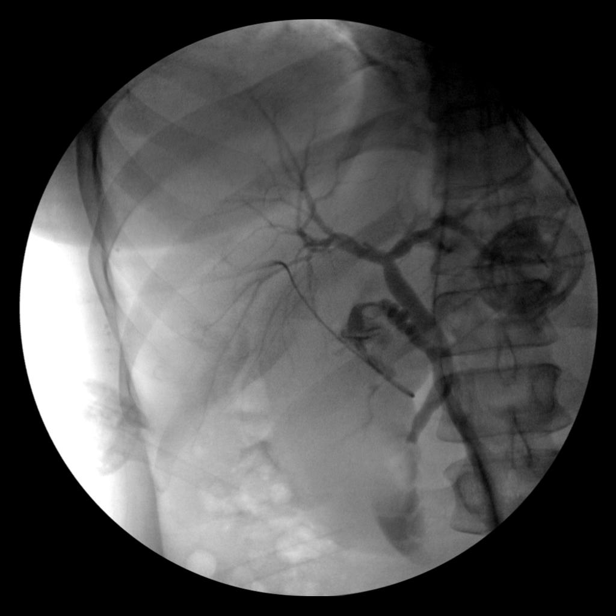
[im 4/4]
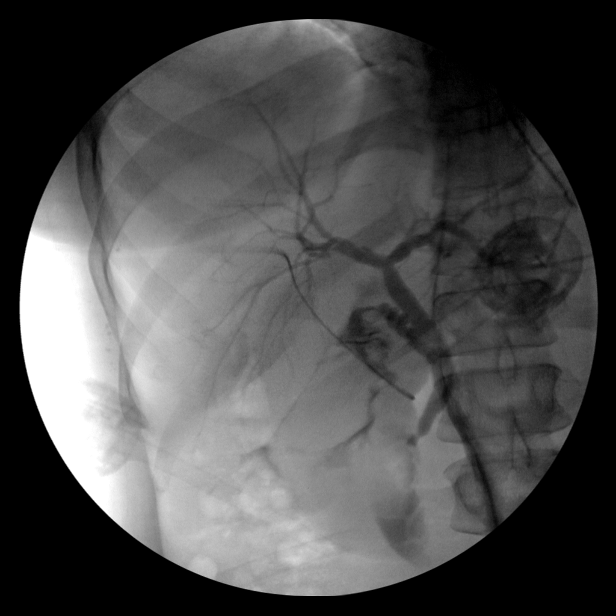
[im 4/4]
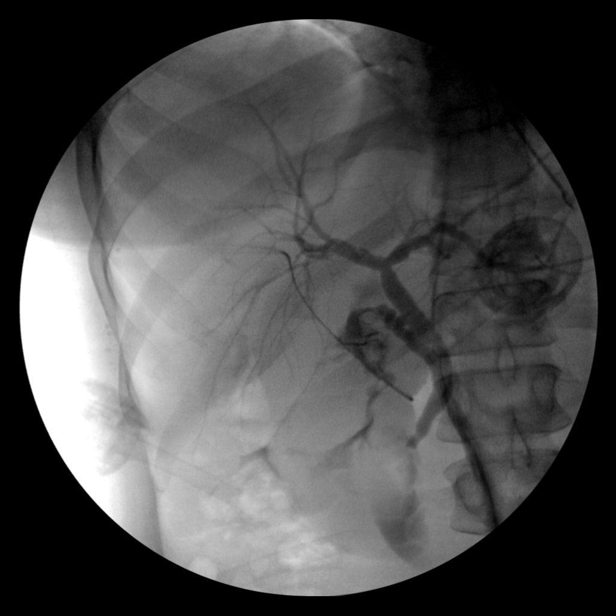

[15 of 16 positions shown; findings below may reference images not displayed]

FINDINGS: Cystic duct was cannulated and contrast was injected into the
biliary system. The initial images demonstrate a nonocclusive
filling defect in distal common bile duct compatible with a stone.
There was some drainage into the duodenum. Contrast refluxes into
the intrahepatic and extrahepatic biliary system. Mild dilatation of
the extrahepatic biliary system. There is extravasation of contrast
at the cystic duct related to the cannulation site. The stone in the
distal common bile duct is more obstructive on the final images.
IMPRESSION: Persistent filling defect in the distal common bile duct is
compatible with a stone. There appears to be intermittent or partial
obstruction related to this stone.

## 2018-11-12 NOTE — L&D Delivery Note (Signed)
Delivery Note   Patient Name: Olivia Gordon DOB: December 02, 1985 MRN: [id number]  Date of admission: 10/14/2019 Delivering MD: Noralyn Pick  Date of delivery: 10/15/19 Type of delivery: SVD  Newborn Data: Live born female  Birth Weight: 4 lb 13.3 oz (2190 g) APGAR: 7, 8  Newborn Delivery   Birth date/time: 10/15/2019 21:00:00 Delivery type: Vaginal, Spontaneous     Olivia Gordon, 33 y.o., @ [redacted]w[redacted]d,  [id number], who was admitted for PTL and PPROM. I was called to the room when she progressed +2 station in the second stage of labor.  She pushed for 15/min.  NICU was present in the room. She delivered a viable infant, cephalic and restituted to the OA position over an intact perineum.  A nuchal cord   was not identified. The baby was placed on maternal abdomen while initial step of NRP were perfmored (Dry, Stimulated, and warmed). Hat placed on baby for thermoregulation. Delayed cord clamping was performed for 30 seconds minutes.  Cord double clamped and cut.  Cord cut by Father. Apgar scores were 7 and 8. Infant then brought to radiant warmer for further assessment by NICU. Infant then taken to NICU with father at her side. Pt remained in bed in stable condition.  Prophylactic Pitocin was started in the third stage of labor for active management. The placenta delivered spontaneously, shultz, with a 3 vessel cord and was sent to LD.  Inspection revealed none. An examination of the vaginal vault and cervix was free from lacerations. The uterus was firm, bleeding stable.  Placenta and umbilical artery blood gas were sent.  There were no complications during the procedure. Left in stable condition.  Maternal Info: Anesthesia: All natural Episiotomy: no Lacerations:  no Suture Repair: no Est. Blood Loss (mL):  168mls  Newborn Info:  Baby Sex: female Babies Name: Zoe APGAR (1 MIN): 7   APGAR (5 MINS): 8   APGAR (10 MINS):     Mom to postpartum.  Baby to Couplet care / Skin to Skin.  Dr  Charlesetta Garibaldi aware of birth   [place], [place], NP-C 10/15/19 9:49 PM

## 2019-03-27 ENCOUNTER — Inpatient Hospital Stay (HOSPITAL_COMMUNITY)
Admission: AD | Admit: 2019-03-27 | Discharge: 2019-03-28 | Disposition: A | Discharge status: Home / Self Care | Payer: BC Managed Care – PPO | Attending: Obstetrics & Gynecology | Admitting: Obstetrics & Gynecology

## 2019-03-27 ENCOUNTER — Encounter (HOSPITAL_COMMUNITY): Payer: Self-pay

## 2019-03-27 ENCOUNTER — Other Ambulatory Visit: Payer: Self-pay

## 2019-03-27 DIAGNOSIS — R112 Nausea with vomiting, unspecified: Secondary | ICD-10-CM | POA: Diagnosis present

## 2019-03-27 LAB — URINALYSIS, ROUTINE W REFLEX MICROSCOPIC
Bilirubin Urine: NEGATIVE
Glucose, UA: NEGATIVE mg/dL
Ketones, ur: 80 mg/dL — AB
Leukocytes,Ua: NEGATIVE
Nitrite: NEGATIVE
Protein, ur: 30 mg/dL — AB
Specific Gravity, Urine: 1.02 (ref 1.005–1.030)
pH: 7 (ref 5.0–8.0)

## 2019-03-27 LAB — CBC
HCT: 43.5 % (ref 36.0–46.0)
Hemoglobin: 14.9 g/dL (ref 12.0–15.0)
MCH: 33.3 pg (ref 26.0–34.0)
MCHC: 34.3 g/dL (ref 30.0–36.0)
MCV: 97.3 fL (ref 80.0–100.0)
Platelets: 184 10*3/uL (ref 150–400)
RBC: 4.47 MIL/uL (ref 3.87–5.11)
RDW: 12 % (ref 11.5–15.5)
WBC: 5.3 10*3/uL (ref 4.0–10.5)
nRBC: 0 % (ref 0.0–0.2)

## 2019-03-27 LAB — LIPASE, BLOOD: Lipase: 26 U/L (ref 11–51)

## 2019-03-27 LAB — COMPREHENSIVE METABOLIC PANEL
ALT: 21 U/L (ref 0–44)
AST: 22 U/L (ref 15–41)
Albumin: 4.4 g/dL (ref 3.5–5.0)
Alkaline Phosphatase: 51 U/L (ref 38–126)
Anion gap: 14 (ref 5–15)
BUN: 5 mg/dL — ABNORMAL LOW (ref 6–20)
CO2: 20 mmol/L — ABNORMAL LOW (ref 22–32)
Calcium: 9.3 mg/dL (ref 8.9–10.3)
Chloride: 106 mmol/L (ref 98–111)
Creatinine, Ser: 0.94 mg/dL (ref 0.44–1.00)
GFR calc Af Amer: >60 mL/min (ref >60)
GFR calc non Af Amer: >60 mL/min (ref >60)
Glucose, Bld: 123 mg/dL — ABNORMAL HIGH (ref 70–99)
Potassium: 3.4 mmol/L — ABNORMAL LOW (ref 3.5–5.1)
Sodium: 140 mmol/L (ref 135–145)
Total Bilirubin: 1.1 mg/dL (ref 0.3–1.2)
Total Protein: 7.4 g/dL (ref 6.5–8.1)

## 2019-03-27 LAB — I-STAT BETA HCG BLOOD, ED (MC, WL, AP ONLY): I-stat hCG, quantitative: 457.8 m[IU]/mL — ABNORMAL HIGH (ref <5)

## 2019-03-27 MED ORDER — PROMETHAZINE HCL 25 MG/ML IJ SOLN
25.0000 mg | Freq: Once | INTRAMUSCULAR | Status: AC
  Administered 2019-03-27: 25 mg via INTRAVENOUS
  Filled 2019-03-27: qty 1

## 2019-03-27 MED ORDER — ONDANSETRON 4 MG PO TBDP
4.0000 mg | ORAL_TABLET | Freq: Once | ORAL | Status: AC
  Administered 2019-03-27: 4 mg via ORAL
  Filled 2019-03-27: qty 1

## 2019-03-27 MED ORDER — LACTATED RINGERS IV BOLUS
1000.0000 mL | Freq: Once | INTRAVENOUS | Status: AC
  Administered 2019-03-27: 1000 mL via INTRAVENOUS

## 2019-03-27 MED ORDER — SODIUM CHLORIDE 0.9% FLUSH: 3.0000 mL | Freq: Once | INTRAVENOUS | Status: DC

## 2019-03-27 NOTE — MAU Provider Note (Signed)
History     CSN: [id number]  Arrival date and time: 03/27/19 2002   First Provider Initiated Contact with Patient 03/27/19 2226      Chief Complaint  Patient presents with  . Emesis   Ms. Olivia Gordon is a 33 y.o. G2P0101 at Unknown who presents to MAU for N/V. Of note, pt is actively vomiting while talking to provider. Pt was transferred from ED after an elevated hCG was found on labs. Pt reports smoking marijuana and last smoked yesterday.  Onset: last night Location: abdomen Duration: ~24hrs Character: nausea constant, pt reports vomiting constantly, unable eat or drink anything x24hrs Aggravating/Associated: none/none Relieving: none Treatment: none  Pt denies VB, vaginal discharge/odor/itching. Pt denies abdominal pain, constipation, diarrhea, or urinary problems. Pt denies fever, chills, fatigue, sweating or changes in appetite. Pt denies SOB or chest pain. Pt denies dizziness, HA, light-headedness, weakness.  Problems this pregnancy include: pt has not yet been seen. Allergies? NKDA Current medications/supplements? none Prenatal care provider? None, pt found out in ED today that she was pregnant, only missed her menses by 1day; pt will go see [facility] for pregnancy care   OB History    Gravida  2   Para  1   Term  0   Preterm  1   AB  0   Living  1     SAB  0   TAB  0   Ectopic  0   Multiple  0   Live Births  1           Past Medical History:  Diagnosis Date  . Pancreatitis     Past Surgical History:  Procedure Laterality Date  . CHOLECYSTECTOMY N/A 07/18/2017   Procedure: LAPAROSCOPIC CHOLECYSTECTOMY WITH INTRAOPERATIVE CHOLANGIOGRAM;  Surgeon: Darnell Level, MD;  Location: [facility];  Service: General;  Laterality: N/A;  . ERCP Left 07/19/2017   Procedure: ENDOSCOPIC RETROGRADE CHOLANGIOPANCREATOGRAPHY (ERCP);  Surgeon: Kerin Salen, MD;  Location: [facility];  Service: Gastroenterology;  Laterality: Left;    History reviewed. No  pertinent family history.  Social History   Tobacco Use  . Smoking status: Never Smoker  . Smokeless tobacco: Never Used  Substance Use Topics  . Alcohol use: No  . Drug use: Yes    Types: Marijuana    Comment: last used 03/27/19    Allergies: No Known Allergies  Medications Prior to Admission  Medication Sig Dispense Refill Last Dose  . bismuth subsalicylate (PEPTO BISMOL) 262 MG/15ML suspension Take 30 mLs by mouth every 6 (six) hours as needed.   03/27/2019 at Unknown time  . HYDROcodone-acetaminophen (NORCO/VICODIN) 5-325 MG tablet Take 1-2 tablets by mouth every 4 (four) hours as needed for moderate pain. 15 tablet 0  at Unknown time  . levonorgestrel (MIRENA, 52 MG,) 20 MCG/24HR IUD Mirena 20 mcg/24 hr (5 years) intrauterine device     . metoCLOPramide (REGLAN) 10 MG tablet Take 1 tablet (10 mg total) by mouth every 6 (six) hours as needed for nausea. 30 tablet 0   . ondansetron (ZOFRAN ODT) 4 MG disintegrating tablet Take 1 tablet (4 mg total) by mouth every 8 (eight) hours as needed for nausea or vomiting. (Patient not taking: Reported on 07/04/2017) 20 tablet 0 Completed Course at Unknown time  . oxyCODONE-acetaminophen (PERCOCET/ROXICET) 5-325 MG tablet Take 1 tablet by mouth every 6 (six) hours as needed for severe pain. (Patient not taking: Reported on 07/04/2017) 10 tablet 0 Completed Course at Unknown time  . promethazine (PHENERGAN) 25  MG suppository Place 1 suppository rectally 3 (three) times daily as needed.       Review of Systems  Constitutional: Negative for chills, diaphoresis, fatigue and fever.  Respiratory: Negative for shortness of breath.   Cardiovascular: Negative for chest pain.  Gastrointestinal: Positive for nausea and vomiting. Negative for abdominal pain, constipation and diarrhea.  Genitourinary: Negative for dysuria, flank pain, frequency, pelvic pain, urgency, vaginal bleeding and vaginal discharge.  Neurological: Negative for dizziness, weakness,  light-headedness and headaches.   Physical Exam   Blood pressure 140/74, pulse (!) 57, temperature 97.8 F (36.6 C), temperature source Oral, resp. rate 18, last menstrual period 02/28/2019, SpO2 100 %.  Patient Vitals for the past 24 hrs:  BP Temp Temp src Pulse Resp SpO2  03/27/19 2130 140/74 97.8 F (36.6 C) Oral (!) 57 18 100 %  03/27/19 2006 114/78 98.7 F (37.1 C) Oral 75 16 100 %   Physical Exam  Constitutional: She is oriented to person, place, and time. She appears well-developed and well-nourished. She appears distressed.  HENT:  Head: Normocephalic and atraumatic.  Respiratory: Effort normal.  GI: Soft. She exhibits no distension and no mass. There is no abdominal tenderness. There is no rebound and no guarding.  Neurological: She is alert and oriented to person, place, and time.  Skin: Skin is warm and dry. She is not diaphoretic.  Psychiatric: She has a normal mood and affect. Her behavior is normal. Judgment and thought content normal.   Results for orders placed or performed during the hospital encounter of 03/27/19 (from the past 24 hour(s))  Urinalysis, Routine w reflex microscopic     Status: Abnormal   Collection Time: 03/27/19  8:07 PM  Result Value Ref Range   Color, Urine YELLOW YELLOW   APPearance HAZY (A) CLEAR   Specific Gravity, Urine 1.020 1.005 - 1.030   pH 7.0 5.0 - 8.0   Glucose, UA NEGATIVE NEGATIVE mg/dL   Hgb urine dipstick SMALL (A) NEGATIVE   Bilirubin Urine NEGATIVE NEGATIVE   Ketones, ur 80 (A) NEGATIVE mg/dL   Protein, ur 30 (A) NEGATIVE mg/dL   Nitrite NEGATIVE NEGATIVE   Leukocytes,Ua NEGATIVE NEGATIVE   RBC / HPF 0-5 0 - 5 RBC/hpf   WBC, UA 0-5 0 - 5 WBC/hpf   Bacteria, UA RARE (A) NONE SEEN   Squamous Epithelial / LPF 0-5 0 - 5   Mucus PRESENT   Lipase, blood     Status: None   Collection Time: 03/27/19  8:12 PM  Result Value Ref Range   Lipase 26 11 - 51 U/L  Comprehensive metabolic panel     Status: Abnormal   Collection  Time: 03/27/19  8:12 PM  Result Value Ref Range   Sodium 140 135 - 145 mmol/L   Potassium 3.4 (L) 3.5 - 5.1 mmol/L   Chloride 106 98 - 111 mmol/L   CO2 20 (L) 22 - 32 mmol/L   Glucose, Bld 123 (H) 70 - 99 mg/dL   BUN 5 (L) 6 - 20 mg/dL   Creatinine, Ser 4.090.94 0.44 - 1.00 mg/dL   Calcium 9.3 8.9 - 81.110.3 mg/dL   Total Protein 7.4 6.5 - 8.1 g/dL   Albumin 4.4 3.5 - 5.0 g/dL   AST 22 15 - 41 U/L   ALT 21 0 - 44 U/L   Alkaline Phosphatase 51 38 - 126 U/L   Total Bilirubin 1.1 0.3 - 1.2 mg/dL   GFR calc non Af Amer >60 >60 mL/min  GFR calc Af Amer >60 >60 mL/min   Anion gap 14 5 - 15  CBC     Status: None   Collection Time: 03/27/19  8:12 PM  Result Value Ref Range   WBC 5.3 4.0 - 10.5 K/uL   RBC 4.47 3.87 - 5.11 MIL/uL   Hemoglobin 14.9 12.0 - 15.0 g/dL   HCT 58.8 50.2 - 77.4 %   MCV 97.3 80.0 - 100.0 fL   MCH 33.3 26.0 - 34.0 pg   MCHC 34.3 30.0 - 36.0 g/dL   RDW 12.8 78.6 - 76.7 %   Platelets 184 150 - 400 K/uL   nRBC 0.0 0.0 - 0.2 %  I-Stat beta hCG blood, ED     Status: Abnormal   Collection Time: 03/27/19  8:17 PM  Result Value Ref Range   I-stat hCG, quantitative 457.8 (H) <5 mIU/mL   Comment 3           No results found.  MAU Course  Procedures  MDM -N/V of pregnancy, first trimester, exact GA unknown, pt reports her menses is late by 1day -pt actively vomiting while speaking with provider -UA: hazy/sm hgb/80 ketones/30PRO/rare bacteria, sending urine for culture -Lipase: WNL -CMP: no abnormalities requiring treatment at this time -CBC: WNL -1L LR + 25mg  phenergan given IV, N/V slightly improved, pt appearance much improved, but pt still actively vomiting multiple times in MAU -discussed with patient options for medical management of N/V in pregnancy, including Zofran and discussed risks vs. benefits of using Zofran in pregnancy; after discussion and pt questions were asked and answered, pt elects to use Zofran for management of N/V in pregnancy here in MAU -2nd L  of LR given with Zofran 4mg  + Pepcid 20mg , pt reports N/V unchanged, though she reports she is not actively vomiting anything up and is only dry heaving with a burning sensation in her chest -spoke with Dr. Emelda Fear at 0300 regarding intractable nausea and vomiting and course of care along with suspicion of N/V related to marijuana use - discussed haloperidol vs. Reglan, Dr. Emelda Fear advises Reglan 10mg  IV and reports is OK to give with 25mg  phenergan given @2246  -after reglan, pt reports N/V mostly resolved -PO challenge successful -pt discharged to home in stable condition  Orders Placed This Encounter  Procedures  . Culture, OB Urine    Standing Status:   Standing    Number of Occurrences:   1  . Lipase, blood    Standing Status:   Standing    Number of Occurrences:   1  . Comprehensive metabolic panel    Standing Status:   Standing    Number of Occurrences:   1  . CBC    Standing Status:   Standing    Number of Occurrences:   1  . Urinalysis, Routine w reflex microscopic    Standing Status:   Standing    Number of Occurrences:   1  . Diet NPO time specified    Standing Status:   Standing    Number of Occurrences:   1  . Saline Lock IV, Maintain IV access    Standing Status:   Standing    Number of Occurrences:   1  . I-Stat beta hCG blood, ED    Standing Status:   Standing    Number of Occurrences:   1  . Insert peripheral IV    Standing Status:   Standing    Number of Occurrences:   1  .  Discharge patient    Order Specific Question:   Discharge disposition    Answer:   01-Home or Self Care [1]    Order Specific Question:   Discharge patient date    Answer:   03/28/2019   Meds ordered this encounter  Medications  . sodium chloride flush (NS) 0.9 % injection 3 mL  . ondansetron (ZOFRAN-ODT) disintegrating tablet 4 mg  . lactated ringers bolus 1,000 mL  . promethazine (PHENERGAN) injection 25 mg  . lactated ringers bolus 1,000 mL  . ondansetron (ZOFRAN) injection 4 mg   . famotidine (PEPCID) IVPB 20 mg premix  . metoCLOPramide (REGLAN) injection 10 mg  . metoCLOPramide (REGLAN) 10 MG tablet    Sig: Take 1 tablet (10 mg total) by mouth 3 (three) times daily with meals for 30 days.    Dispense:  90 tablet    Refill:  0    Order Specific Question:   Supervising Provider    Answer:   Tilda Burrow [[id number]]    Assessment and Plan   1. Non-intractable vomiting with nausea, unspecified vomiting type    Allergies as of 03/28/2019   No Known Allergies     Medication List    STOP taking these medications   Mirena (52 MG) 20 MCG/24HR IUD Generic drug:  levonorgestrel   ondansetron 4 MG disintegrating tablet Commonly known as:  Zofran ODT   promethazine 25 MG suppository Commonly known as:  PHENERGAN     TAKE these medications   bismuth subsalicylate 262 MG/15ML suspension Commonly known as:  PEPTO BISMOL Take 30 mLs by mouth every 6 (six) hours as needed.   HYDROcodone-acetaminophen 5-325 MG tablet Commonly known as:  NORCO/VICODIN Take 1-2 tablets by mouth every 4 (four) hours as needed for moderate pain.   metoCLOPramide 10 MG tablet Commonly known as:  REGLAN Take 1 tablet (10 mg total) by mouth 3 (three) times daily with meals for 30 days. What changed:    when to take this  reasons to take this   oxyCODONE-acetaminophen 5-325 MG tablet Commonly known as:  PERCOCET/ROXICET Take 1 tablet by mouth every 6 (six) hours as needed for severe pain.      -discussed taking pharmacologic medications as prescribed and not skipping doses -precautions given for Reglan side effects -discussed avoidance of marijuana use in pregnancy and opting for prescribed medications to treat N/V as marijuana use is not considered healthy in pregnancy and can cause issues with cyclic nausea and vomiting -discussed non-pharmacologic treatment of N/V in pregnancy -discussed expectations of N/V in pregnancy -strict hyperemesis/return MAU precautions  given -pt to call [facility] on Monday to schedule new OB/ f/u appt for N/V -pt discharged to home in stable condition  Joni Reining E Nugent 03/28/2019, 5:24 AM

## 2019-03-27 NOTE — ED Triage Notes (Signed)
Pt reports vomiting since last night, denies abd pain

## 2019-03-27 NOTE — MAU Note (Addendum)
Pt reports vomiting that started last night that has not stopped. Also reports feeling "balls in stomach that hurt". Reports that sthe upper stomach pain started today. Reports she is now only vomiting yellow stuff. Pt tried pepto bismol, gatorade, water, but nothing is helping. Pt denies vaginal bleeding. LMP: 02/28/2019.

## 2019-03-28 MED ORDER — FAMOTIDINE IN NACL 20-0.9 MG/50ML-% IV SOLN
20.0000 mg | Freq: Once | INTRAVENOUS | Status: AC
  Administered 2019-03-28: 20 mg via INTRAVENOUS
  Filled 2019-03-28: qty 50

## 2019-03-28 MED ORDER — METOCLOPRAMIDE HCL 10 MG PO TABS: 10.0000 mg | ORAL_TABLET | Freq: Three times a day (TID) | ORAL | 0 refills | Status: DC

## 2019-03-28 MED ORDER — ONDANSETRON HCL 4 MG/2ML IJ SOLN
4.0000 mg | Freq: Once | INTRAMUSCULAR | Status: AC
  Administered 2019-03-28: 4 mg via INTRAVENOUS
  Filled 2019-03-28: qty 2

## 2019-03-28 MED ORDER — LACTATED RINGERS IV BOLUS
1000.0000 mL | Freq: Once | INTRAVENOUS | Status: AC
  Administered 2019-03-28: 1000 mL via INTRAVENOUS

## 2019-03-28 MED ORDER — METOCLOPRAMIDE HCL 5 MG/ML IJ SOLN
10.0000 mg | Freq: Once | INTRAMUSCULAR | Status: AC
  Administered 2019-03-28: 10 mg via INTRAVENOUS
  Filled 2019-03-28: qty 2

## 2019-03-28 NOTE — Discharge Instructions (Signed)
[facility]     [facility]     Phone: [phone]  [facility] [place]  Phone: [phone]  [facility] [place]  Phone: [phone]  [facility]                           Phone: [phone]  [facility]          Phone: [phone]  [facility]    Phone: [phone]   [facility] [place] [place]    Phone: [phone]  [facility]    Phone: [phone]  [facility]    Phone: [phone]  [facility]    Phone: [phone]  [facility]  Phone: [phone]  [facility]               Phone: [phone]  [facility]   Phone: [phone]  [facility]    Phone: [phone]    Morning Sickness  Morning sickness is when a woman feels nauseous during pregnancy. This nauseous feeling may or may not come with vomiting. It often occurs in the morning, but it can be a problem at any time of day. Morning sickness is most common during the first trimester. In some cases, it may continue throughout pregnancy. Although morning sickness is unpleasant, it is usually harmless unless the woman develops severe and continual vomiting (hyperemesis gravidarum), a condition that requires more intense treatment. What are the causes? The exact cause of this condition is not known, but it seems to be related to normal hormonal changes that occur in pregnancy. What increases the risk? You are more likely to develop this condition if:  You experienced nausea or vomiting before your pregnancy.  You had morning sickness during a previous pregnancy.  You are pregnant with more than one baby, such as twins. What are the signs or symptoms? Symptoms of this  condition include:  Nausea.  Vomiting. How is this diagnosed? This condition is usually diagnosed based on your signs and symptoms. How is this treated? In many cases, treatment is not needed for this condition. Making some changes to what you eat may help to control symptoms. Your health care provider may also prescribe or recommend:  Vitamin B6 supplements.  Anti-nausea medicines.  Ginger. Follow these instructions at home: Medicines  Take over-the-counter and prescription medicines only as told by your health care provider. Do not use any prescription, over-the-counter, or herbal medicines for morning sickness without first talking with your health care provider.  Taking multivitamins before getting pregnant can prevent or decrease the severity of morning sickness in most women. Eating and drinking  Eat a piece of dry toast or crackers before getting out of bed in the morning.  Eat 5 or 6 small meals a day.  Eat dry and bland foods, such as rice or a baked potato. Foods that are high in carbohydrates are often helpful.  Avoid greasy, fatty, and spicy foods.  Have someone cook for you if the smell of any food causes nausea and vomiting.  If you feel nauseous after taking prenatal vitamins, take the vitamins at night or with a snack.  Snack on protein foods between meals if you are hungry. Nuts, yogurt, and cheese are good options.  Drink fluids throughout the day.  Try  ginger ale made with real ginger, ginger tea made from fresh grated ginger, or ginger candies. General instructions  Do not use any products that contain nicotine or tobacco, such as cigarettes and e-cigarettes. If you need help quitting, ask your health care provider.  Get an air purifier to keep the air in your house free of odors.  Get plenty of fresh air.  Try to avoid odors that trigger your nausea.  Consider trying these methods to help relieve symptoms: ? Wearing an acupressure wristband.  These wristbands are often worn for seasickness. ? Acupuncture. Contact a health care provider if:  Your home remedies are not working and you need medicine.  You feel dizzy or light-headed.  You are losing weight. Get help right away if:  You have persistent and uncontrolled nausea and vomiting.  You faint.  You have severe pain in your abdomen. Summary  Morning sickness is when a woman feels nauseous during pregnancy. This nauseous feeling may or may not come with vomiting.  Morning sickness is most common during the first trimester.  It often occurs in the morning, but it can be a problem at any time of day.  In many cases, treatment is not needed for this condition. Making some changes to what you eat may help to control symptoms. This information is not intended to replace advice given to you by your health care provider. Make sure you discuss any questions you have with your health care provider. Document Released: 12/20/2006 Document Revised: 12/01/2016 Document Reviewed: 12/01/2016 Elsevier Interactive Patient Education  2019 [employer].

## 2019-03-29 LAB — CULTURE, OB URINE: Culture: <10000 — AB

## 2019-04-17 DIAGNOSIS — Z8751 Personal history of pre-term labor: Secondary | ICD-10-CM | POA: Insufficient documentation

## 2019-04-21 DIAGNOSIS — Z3490 Encounter for supervision of normal pregnancy, unspecified, unspecified trimester: Secondary | ICD-10-CM | POA: Insufficient documentation

## 2019-04-23 DIAGNOSIS — Z8619 Personal history of other infectious and parasitic diseases: Secondary | ICD-10-CM | POA: Insufficient documentation

## 2019-05-14 DIAGNOSIS — A6000 Herpesviral infection of urogenital system, unspecified: Secondary | ICD-10-CM | POA: Insufficient documentation

## 2019-05-17 ENCOUNTER — Encounter (HOSPITAL_COMMUNITY): Payer: Self-pay

## 2019-05-17 ENCOUNTER — Other Ambulatory Visit: Payer: Self-pay

## 2019-05-17 ENCOUNTER — Inpatient Hospital Stay (HOSPITAL_COMMUNITY)
Admission: AD | Admit: 2019-05-17 | Discharge: 2019-05-17 | Disposition: A | Discharge status: Home / Self Care | Payer: BC Managed Care – PPO | Attending: Obstetrics & Gynecology | Admitting: Obstetrics & Gynecology

## 2019-05-17 ENCOUNTER — Inpatient Hospital Stay (HOSPITAL_COMMUNITY): Payer: BC Managed Care – PPO

## 2019-05-17 DIAGNOSIS — O26891 Other specified pregnancy related conditions, first trimester: Secondary | ICD-10-CM

## 2019-05-17 DIAGNOSIS — R109 Unspecified abdominal pain: Secondary | ICD-10-CM

## 2019-05-17 DIAGNOSIS — O219 Vomiting of pregnancy, unspecified: Secondary | ICD-10-CM | POA: Diagnosis not present

## 2019-05-17 DIAGNOSIS — O2311 Infections of bladder in pregnancy, first trimester: Secondary | ICD-10-CM | POA: Diagnosis not present

## 2019-05-17 DIAGNOSIS — Z8719 Personal history of other diseases of the digestive system: Secondary | ICD-10-CM

## 2019-05-17 DIAGNOSIS — Z3A11 11 weeks gestation of pregnancy: Secondary | ICD-10-CM | POA: Diagnosis not present

## 2019-05-17 DIAGNOSIS — N764 Abscess of vulva: Secondary | ICD-10-CM | POA: Insufficient documentation

## 2019-05-17 DIAGNOSIS — K4090 Unilateral inguinal hernia, without obstruction or gangrene, not specified as recurrent: Secondary | ICD-10-CM | POA: Insufficient documentation

## 2019-05-17 DIAGNOSIS — N3000 Acute cystitis without hematuria: Secondary | ICD-10-CM

## 2019-05-17 DIAGNOSIS — R7401 Elevation of levels of liver transaminase levels: Secondary | ICD-10-CM

## 2019-05-17 DIAGNOSIS — R740 Nonspecific elevation of levels of transaminase and lactic acid dehydrogenase [LDH]: Secondary | ICD-10-CM

## 2019-05-17 DIAGNOSIS — R1013 Epigastric pain: Secondary | ICD-10-CM | POA: Diagnosis not present

## 2019-05-17 LAB — CBC WITH DIFFERENTIAL/PLATELET
Abs Immature Granulocytes: 0.02 10*3/uL (ref 0.00–0.07)
Basophils Absolute: 0 10*3/uL (ref 0.0–0.1)
Basophils Relative: 0 %
Eosinophils Absolute: 0 10*3/uL (ref 0.0–0.5)
Eosinophils Relative: 0 %
HCT: 41.3 % (ref 36.0–46.0)
Hemoglobin: 14.7 g/dL (ref 12.0–15.0)
Immature Granulocytes: 0 %
Lymphocytes Relative: 18 %
Lymphs Abs: 0.9 10*3/uL (ref 0.7–4.0)
MCH: 33.9 pg (ref 26.0–34.0)
MCHC: 35.6 g/dL (ref 30.0–36.0)
MCV: 95.2 fL (ref 80.0–100.0)
Monocytes Absolute: 0.4 10*3/uL (ref 0.1–1.0)
Monocytes Relative: 9 %
Neutro Abs: 3.4 10*3/uL (ref 1.7–7.7)
Neutrophils Relative %: 73 %
Platelets: 186 10*3/uL (ref 150–400)
RBC: 4.34 MIL/uL (ref 3.87–5.11)
RDW: 12.4 % (ref 11.5–15.5)
WBC: 4.7 10*3/uL (ref 4.0–10.5)
nRBC: 0 % (ref 0.0–0.2)

## 2019-05-17 LAB — COMPREHENSIVE METABOLIC PANEL
ALT: 1085 U/L — ABNORMAL HIGH (ref 0–44)
AST: 689 U/L — ABNORMAL HIGH (ref 15–41)
Albumin: 3.7 g/dL (ref 3.5–5.0)
Alkaline Phosphatase: 107 U/L (ref 38–126)
Anion gap: 8 (ref 5–15)
BUN: <5 mg/dL — ABNORMAL LOW (ref 6–20)
CO2: 25 mmol/L (ref 22–32)
Calcium: 9.3 mg/dL (ref 8.9–10.3)
Chloride: 104 mmol/L (ref 98–111)
Creatinine, Ser: 0.69 mg/dL (ref 0.44–1.00)
GFR calc Af Amer: >60 mL/min (ref >60)
GFR calc non Af Amer: >60 mL/min (ref >60)
Glucose, Bld: 93 mg/dL (ref 70–99)
Potassium: 3.9 mmol/L (ref 3.5–5.1)
Sodium: 137 mmol/L (ref 135–145)
Total Bilirubin: 1.6 mg/dL — ABNORMAL HIGH (ref 0.3–1.2)
Total Protein: 6.5 g/dL (ref 6.5–8.1)

## 2019-05-17 LAB — URINALYSIS, ROUTINE W REFLEX MICROSCOPIC
Bacteria, UA: NONE SEEN
Bilirubin Urine: NEGATIVE
Glucose, UA: NEGATIVE mg/dL
Ketones, ur: 20 mg/dL — AB
Leukocytes,Ua: NEGATIVE
Nitrite: NEGATIVE
Protein, ur: NEGATIVE mg/dL
Specific Gravity, Urine: 1.021 (ref 1.005–1.030)
pH: 5 (ref 5.0–8.0)

## 2019-05-17 LAB — LIPASE, BLOOD: Lipase: 25 U/L (ref 11–51)

## 2019-05-17 LAB — AMYLASE: Amylase: 72 U/L (ref 28–100)

## 2019-05-17 MED ORDER — TRAMADOL HCL 50 MG PO TABS: 50.0000 mg | ORAL_TABLET | Freq: Four times a day (QID) | ORAL | 0 refills | Status: DC | PRN

## 2019-05-17 MED ORDER — NITROFURANTOIN MONOHYD MACRO 100 MG PO CAPS: 100.0000 mg | ORAL_CAPSULE | Freq: Two times a day (BID) | ORAL | 0 refills | Status: DC

## 2019-05-17 MED ORDER — PROMETHAZINE HCL 25 MG PO TABS: 25.0000 mg | ORAL_TABLET | Freq: Four times a day (QID) | ORAL | 2 refills | Status: DC | PRN

## 2019-05-17 NOTE — MAU Provider Note (Signed)
Chief Complaint: Abdominal Pain   First Provider Initiated Contact with Patient 05/17/19 1347      SUBJECTIVE HPI: Olivia Gordon is a 33 y.o. G2P0101 at 4374w1d who presents to Maternity Admissions reporting upper abdominal pain and vomiting since 05/15/2019.  No prior nausea or vomiting this pregnancy.  States she was prescribed Keflex on Thursday, 05/14/2019 for UTI.  Started Thursday night.  Symptoms started early Friday.  Minimal pain and vomiting today, but has not taken Keflex doses today.  [facility].  Awaiting results of urine culture.  Location: Epigastric Quality: Sharp, stabbing Severity: 10/10 on pain scale Duration: 3 days Context: [redacted] weeks gestation.  Recently started Keflex Timing: Intermittent every 3-4 hours. Modifying factors: Pain episodes resolve after vomiting.  Has not tried any medicine for pain or matting. Associated signs and symptoms: Negative for fever, chills, change in appetite, nausea, diarrhea, constipation or lower abdominal pain.  History of cholecystectomy in 2018 and pancreatitis.  Past Medical History:  Diagnosis Date  . Pancreatitis    OB History  Gravida Para Term Preterm AB Living  2 1 0 1 0 1  SAB TAB Ectopic Multiple Live Births  0 0 0 0 1    # Outcome Date GA Lbr Len/2nd Weight Sex Delivery Anes PTL Lv  2 Current           1 Preterm  6518w0d    Vag-Spont   LIV   Past Surgical History:  Procedure Laterality Date  . CHOLECYSTECTOMY N/A 07/18/2017   Procedure: LAPAROSCOPIC CHOLECYSTECTOMY WITH INTRAOPERATIVE CHOLANGIOGRAM;  Surgeon: Darnell LevelGerkin, Todd, MD;  Location: [facility];  Service: General;  Laterality: N/A;  . ERCP Left 07/19/2017   Procedure: ENDOSCOPIC RETROGRADE CHOLANGIOPANCREATOGRAPHY (ERCP);  Surgeon: Kerin SalenKarki, Arya, MD;  [facility];  Service: Gastroenterology;  Laterality: Left;   Social History   Socioeconomic History  . Marital status: Married    Spouse name: Not on file  . Number of children:  Not on file  . Years of education: Not on file  . Highest education level: Not on file  Occupational History  . Not on file  Social Needs  . Financial resource strain: Not on file  . Food insecurity    Worry: Not on file    Inability: Not on file  . Transportation needs    Medical: Not on file    Non-medical: Not on file  Tobacco Use  . Smoking status: Never Smoker  . Smokeless tobacco: Never Used  Substance and Sexual Activity  . Alcohol use: No  . Drug use: Yes    Types: Marijuana    Comment: last used 03/27/19  . Sexual activity: Not Currently  Lifestyle  . Physical activity    Days per week: Not on file    Minutes per session: Not on file  . Stress: Not on file  Relationships  . Social Musicianconnections    Talks on phone: Not on file    Gets together: Not on file    Attends religious service: Not on file    Active member of club or organization: Not on file    Attends meetings of clubs or organizations: Not on file    Relationship status: Not on file  . Intimate partner violence    Fear of current or ex partner: Not on file    Emotionally abused: Not on file    Physically abused: Not on file    Forced sexual activity: Not on file  Other Topics Concern  . Not on file  Social History Narrative  . Not on file   History reviewed. No pertinent family history. No current facility-administered medications on file prior to encounter.    Current Outpatient Medications on File Prior to Encounter  Medication Sig Dispense Refill  . bismuth subsalicylate (PEPTO BISMOL) 262 MG/15ML suspension Take 30 mLs by mouth every 6 (six) hours as needed.    Marland Kitchen HYDROcodone-acetaminophen (NORCO/VICODIN) 5-325 MG tablet Take 1-2 tablets by mouth every 4 (four) hours as needed for moderate pain. 15 tablet 0  . metoCLOPramide (REGLAN) 10 MG tablet Take 1 tablet (10 mg total) by mouth 3 (three) times daily with meals for 30 days. 90 tablet 0  . oxyCODONE-acetaminophen (PERCOCET/ROXICET) 5-325 MG  tablet Take 1 tablet by mouth every 6 (six) hours as needed for severe pain. (Patient not taking: Reported on 07/04/2017) 10 tablet 0   No Known Allergies  I have reviewed patient's Past Medical Hx, Surgical Hx, Family Hx, Social Hx, medications and allergies.   Review of Systems  Constitutional: Negative for appetite change, chills and fever.  Gastrointestinal: Positive for abdominal pain and vomiting. Negative for abdominal distention, blood in stool, constipation, diarrhea and nausea.  Genitourinary: Negative for vaginal bleeding and vaginal discharge.  Musculoskeletal: Negative for back pain.    OBJECTIVE Patient Vitals for the past 24 hrs:  BP Temp Temp src Pulse Resp SpO2 Height Weight  05/17/19 1253 119/73 - - - - - - -  05/17/19 0956 134/81 98.4 F (36.9 C) Oral 87 18 99 % - -  05/17/19 0950 - - - - - - 5\' 5"  (1.651 m) 72.2 kg   Constitutional: Well-developed, well-nourished female in no acute distress.  Skin: No scleral icterus Cardiovascular: normal rate Respiratory: normal rate and effort.  GI: Abd soft, non-tender, gravid appropriate for gestational age. Pos BS x 4 Neurologic: Alert and oriented x 4.  GU: Neg CVAT.  SPECULUM EXAM: Deferred  Fetal heart rate 160 by Doppler  LAB RESULTS Results for orders placed or performed during the hospital encounter of 05/17/19 (from the past 24 hour(s))  Urinalysis, Routine w reflex microscopic     Status: Abnormal   Collection Time: 05/17/19 10:02 AM  Result Value Ref Range   Color, Urine AMBER (A) YELLOW   APPearance CLEAR CLEAR   Specific Gravity, Urine 1.021 1.005 - 1.030   pH 5.0 5.0 - 8.0   Glucose, UA NEGATIVE NEGATIVE mg/dL   Hgb urine dipstick SMALL (A) NEGATIVE   Bilirubin Urine NEGATIVE NEGATIVE   Ketones, ur 20 (A) NEGATIVE mg/dL   Protein, ur NEGATIVE NEGATIVE mg/dL   Nitrite NEGATIVE NEGATIVE   Leukocytes,Ua NEGATIVE NEGATIVE   RBC / HPF 11-20 0 - 5 RBC/hpf   WBC, UA 11-20 0 - 5 WBC/hpf   Bacteria, UA  NONE SEEN NONE SEEN   Squamous Epithelial / LPF 0-5 0 - 5   Mucus PRESENT   CBC with Differential/Platelet     Status: None   Collection Time: 05/17/19 10:53 AM  Result Value Ref Range   WBC 4.7 4.0 - 10.5 K/uL   RBC 4.34 3.87 - 5.11 MIL/uL   Hemoglobin 14.7 12.0 - 15.0 g/dL   HCT 41.3 36.0 - 46.0 %   MCV 95.2 80.0 - 100.0 fL   MCH 33.9 26.0 - 34.0 pg   MCHC 35.6 30.0 - 36.0 g/dL   RDW 12.4 11.5 - 15.5 %   Platelets 186 150 - 400 K/uL  nRBC 0.0 0.0 - 0.2 %   Neutrophils Relative % 73 %   Neutro Abs 3.4 1.7 - 7.7 K/uL   Lymphocytes Relative 18 %   Lymphs Abs 0.9 0.7 - 4.0 K/uL   Monocytes Relative 9 %   Monocytes Absolute 0.4 0.1 - 1.0 K/uL   Eosinophils Relative 0 %   Eosinophils Absolute 0.0 0.0 - 0.5 K/uL   Basophils Relative 0 %   Basophils Absolute 0.0 0.0 - 0.1 K/uL   Immature Granulocytes 0 %   Abs Immature Granulocytes 0.02 0.00 - 0.07 K/uL  Comprehensive metabolic panel     Status: Abnormal   Collection Time: 05/17/19 10:53 AM  Result Value Ref Range   Sodium 137 135 - 145 mmol/L   Potassium 3.9 3.5 - 5.1 mmol/L   Chloride 104 98 - 111 mmol/L   CO2 25 22 - 32 mmol/L   Glucose, Bld 93 70 - 99 mg/dL   BUN <5 (L) 6 - 20 mg/dL   Creatinine, Ser 4.090.69 0.44 - 1.00 mg/dL   Calcium 9.3 8.9 - 81.110.3 mg/dL   Total Protein 6.5 6.5 - 8.1 g/dL   Albumin 3.7 3.5 - 5.0 g/dL   AST 914689 (H) 15 - 41 U/L   ALT 1,085 (H) 0 - 44 U/L   Alkaline Phosphatase 107 38 - 126 U/L   Total Bilirubin 1.6 (H) 0.3 - 1.2 mg/dL   GFR calc non Af Amer >60 >60 mL/min   GFR calc Af Amer >60 >60 mL/min   Anion gap 8 5 - 15  Amylase     Status: None   Collection Time: 05/17/19 10:53 AM  Result Value Ref Range   Amylase 72 28 - 100 U/L  Lipase, blood     Status: None   Collection Time: 05/17/19 10:53 AM  Result Value Ref Range   Lipase 25 11 - 51 U/L    IMAGING [place] Abdomen Complete  Result Date: 05/17/2019 CLINICAL DATA:  Elevated liver enzymes.  Epigastric abdominal pain. EXAM: ABDOMEN  ULTRASOUND COMPLETE COMPARISON:  CT scan of July 29, 2017. FINDINGS: Gallbladder: Status post cholecystectomy. Common bile duct: Diameter: 4 mm which is within normal limits. Liver: No focal lesion identified. Within normal limits in parenchymal echogenicity. Portal vein is patent on color Doppler imaging with normal direction of blood flow towards the liver. IVC: No abnormality visualized. Pancreas: Visualized portion unremarkable. Spleen: Size and appearance within normal limits. Right Kidney: Length: 11.6 cm. Echogenicity within normal limits. No mass or hydronephrosis visualized. Left Kidney: Length: 11.1 cm. Echogenicity within normal limits. No mass or hydronephrosis visualized. Abdominal aorta: No aneurysm visualized. Other findings: None. IMPRESSION: Status post cholecystectomy. No acute abnormality seen in the abdomen. Electronically Signed   By: Lupita RaiderJames  Green Jr M.D.   On: 05/17/2019 13:48    MAU COURSE Orders Placed This Encounter  Procedures  . US Abdomen Complete  . Urinalysis, Routine w reflex microscopic  . CBC with Differential/Platelet  . Comprehensive metabolic panel  . Amylase  . Lipase, blood  . Hepatitis panel, acute  . Discharge patient   Meds ordered this encounter  Medications  . promethazine (PHENERGAN) 25 MG tablet    Sig: Take 1 tablet (25 mg total) by mouth every 6 (six) hours as needed for nausea or vomiting.    Dispense:  30 tablet    Refill:  2    Order Specific Question:   Supervising Provider    Answer:   Reva BoresPRATT, TANYA S [[id number]]  .  traMADol (ULTRAM) 50 MG tablet    Sig: Take 1-2 tablets (50-100 mg total) by mouth every 6 (six) hours as needed for severe pain.    Dispense:  20 tablet    Refill:  0    Order Specific Question:   Supervising Provider    Answer:   Reva BoresPRATT, TANYA S [[id number]]  . nitrofurantoin, macrocrystal-monohydrate, (MACROBID) 100 MG capsule    Sig: Take 1 capsule (100 mg total) by mouth 2 (two) times daily.    Dispense:  14 capsule     Refill:  0    Order Specific Question:   Supervising Provider    Answer:   Reva BoresPRATT, TANYA S [[id number]]    MDM -Transaminitis of uncertain etiology possibly a side effect of Keflex per consult with pharmacy.  Also may be related to vomiting and/or dehydration.  Acute liver panel pending.  Needs recheck of liver enzymes in the next week.  Patient is status post cholecystectomy.  Not consistent with pancreatitis since amylase and lipase are normal.  No known risk factors for hepatitis.  Patient is non-toxic appearing and appropriate for outpatient management.  Will DC Keflex and start Macrobid for UTI (urine culture positive E. coli sensitive to Macrobid).  Instructed patient to push fluids and use Phenergan as needed to be able to stay well-hydrated.  ASSESSMENT 1. Abdominal pain during pregnancy in first trimester   2. Transaminitis   3. Epigastric pain   4. Vomiting affecting pregnancy   5. Acute cystitis without hematuria   6. History of pancreatitis     PLAN Discharge home in stable condition. Abdominal pain precautions Push fluids. Avoid Tylenol. DC Keflex and start Macrobid. Follow-up Information    [facility] [place] [facility].   Specialty: Obstetrics and Gynecology Why: Call to schedule follow-up appointment 05/19/2019 or 05/20/2019 Contact information: [address] [place] [address] [phone]        [facility] Follow up.   Specialty: Obstetrics and Gynecology Why: As needed in pregnancy emergencies Contact information: [address] [id number] [address]c [address] [id number] [phone]         Allergies as of 05/17/2019   No Known Allergies     Medication List    STOP taking these medications   bismuth subsalicylate 262 MG/15ML suspension Commonly known as: PEPTO BISMOL   HYDROcodone-acetaminophen 5-325 MG tablet Commonly known as: NORCO/VICODIN   metoCLOPramide 10 MG tablet Commonly known as:  REGLAN   oxyCODONE-acetaminophen 5-325 MG tablet Commonly known as: PERCOCET/ROXICET     TAKE these medications   nitrofurantoin (macrocrystal-monohydrate) 100 MG capsule Commonly known as: Macrobid Take 1 capsule (100 mg total) by mouth 2 (two) times daily.   promethazine 25 MG tablet Commonly known as: PHENERGAN Take 1 tablet (25 mg total) by mouth every 6 (six) hours as needed for nausea or vomiting.   traMADol 50 MG tablet Commonly known as: Ultram Take 1-2 tablets (50-100 mg total) by mouth every 6 (six) hours as needed for severe pain.        Katrinka BlazingSmith, [place], CNM 05/17/2019  3:12 PM

## 2019-05-17 NOTE — MAU Note (Signed)
Olivia Gordon is a 33 y.o. at [redacted]w[redacted]d here in MAU reporting: saw OB on Thursday for NOB, states she was put on abx for UTI. Since Friday night has been having upper abdominal pain intermittently. States she tried [place] but that didn't help. Had a BM yesterday. States she has had 2 episodes of vomiting in the past 24 hours, no nausea, sometimes when the pain comes she vomits. No vaginal bleeding or discharge.  Onset of complaint: ongoing  Pain score: 10/10  Vitals:   05/17/19 0956  BP: 134/81  Pulse: 87  Resp: 18  Temp: 98.4 F (36.9 C)  SpO2: 99%     Lab orders placed from triage: UA

## 2019-05-17 NOTE — Discharge Instructions (Signed)
Your liver enzymes were elevated at your visit today.  This may be due to medication that you are taking, vomiting and/or dehydration or it could be related to something more serious like a liver infection.  Tests results for liver infections should be back in 3 days.  You will be called with the results.  Stop taking Keflex and start taking Macrobid for your bladder infection.  Drink more water and use Phenergan (nausea medicine) as needed to be able to stay well-hydrated.  Avoid using Tylenol as it may further increase your liver enzymes.  Call [facility] tomorrow morning to schedule a follow-up appointment in the next few days.  They will decide when to repeat your blood work and determine if you need to be referred to a primary care provider or gastroenterologist.

## 2019-05-18 LAB — HEPATITIS PANEL, ACUTE
HCV Ab: <0.1 s/co ratio (ref 0.0–0.9)
Hep A IgM: NEGATIVE
Hep B C IgM: NEGATIVE
Hepatitis B Surface Ag: NEGATIVE

## 2019-09-29 ENCOUNTER — Other Ambulatory Visit: Payer: Self-pay | Admitting: Obstetrics and Gynecology

## 2019-09-29 ENCOUNTER — Other Ambulatory Visit: Payer: Self-pay

## 2019-09-29 ENCOUNTER — Inpatient Hospital Stay (HOSPITAL_BASED_OUTPATIENT_CLINIC_OR_DEPARTMENT_OTHER): Payer: BC Managed Care – PPO

## 2019-09-29 ENCOUNTER — Inpatient Hospital Stay (HOSPITAL_COMMUNITY)
Admission: AD | Admit: 2019-09-29 | Discharge: 2019-09-29 | Disposition: A | Discharge status: Home / Self Care | Payer: BC Managed Care – PPO | Attending: Obstetrics and Gynecology | Admitting: Obstetrics and Gynecology

## 2019-09-29 ENCOUNTER — Encounter (HOSPITAL_COMMUNITY): Payer: Self-pay

## 2019-09-29 DIAGNOSIS — Z3A30 30 weeks gestation of pregnancy: Secondary | ICD-10-CM | POA: Insufficient documentation

## 2019-09-29 DIAGNOSIS — O368331 Maternal care for abnormalities of the fetal heart rate or rhythm, third trimester, fetus 1: Secondary | ICD-10-CM | POA: Diagnosis not present

## 2019-09-29 DIAGNOSIS — O4703 False labor before 37 completed weeks of gestation, third trimester: Secondary | ICD-10-CM

## 2019-09-29 DIAGNOSIS — O479 False labor, unspecified: Secondary | ICD-10-CM

## 2019-09-29 DIAGNOSIS — O4700 False labor before 37 completed weeks of gestation, unspecified trimester: Secondary | ICD-10-CM

## 2019-09-29 DIAGNOSIS — Z3689 Encounter for other specified antenatal screening: Secondary | ICD-10-CM

## 2019-09-29 LAB — URINALYSIS, ROUTINE W REFLEX MICROSCOPIC
Bilirubin Urine: NEGATIVE
Glucose, UA: NEGATIVE mg/dL
Hgb urine dipstick: NEGATIVE
Ketones, ur: NEGATIVE mg/dL
Leukocytes,Ua: NEGATIVE
Nitrite: NEGATIVE
Protein, ur: NEGATIVE mg/dL
Specific Gravity, Urine: 1.008 (ref 1.005–1.030)
pH: 6 (ref 5.0–8.0)

## 2019-09-29 MED ORDER — BETAMETHASONE SOD PHOS & ACET 6 (3-3) MG/ML IJ SUSP
12.0000 mg | INTRAMUSCULAR | Status: DC
  Administered 2019-09-29: 12 mg via INTRAMUSCULAR
  Filled 2019-09-29: qty 5

## 2019-09-29 MED ORDER — LACTATED RINGERS IV BOLUS
1000.0000 mL | Freq: Once | INTRAVENOUS | Status: AC
  Administered 2019-09-29: 1000 mL via INTRAVENOUS

## 2019-09-29 NOTE — MAU Note (Signed)
Patient states she was having [place] and her provider told her to come here because she was actually contracting and 1cm dilated. Endorses +FM. No vaginal bleeding, positive discharge that is sticky.

## 2019-09-29 NOTE — MAU Provider Note (Signed)
History     CSN: [id number]  Arrival date and time: 09/29/19 1107   First Provider Initiated Contact with Patient 09/29/19 1202      Chief Complaint  Patient presents with  . Contractions   Ms. Olivia Gordon is a 33 y.o. G2P0101 at [redacted]w[redacted]d who presents to MAU for PTL evaluation after she was seen for her weekly Makena shot and regular OB visit and reported ctx to her provider. Her cervix was checked at the office and she was found to be 1cm dilated. Pt reports these ctx began about one week ago and are painless and she describes the sensation as a tightening.  Pt reports she was checked for vaginal infections today and was given a prescription for metronidazole.  Pt denies change in vaginal discharge amount/color/consistency, VB, new onset backache, intermittent abdominal discomfort/pain, pelvic pressure/pain, cramping. Pt denies chest pain and SOB.  Pt denies constipation, diarrhea, or urinary problems. Pt denies fever, chills, fatigue, sweating or changes in appetite. Pt denies dizziness, light-headedness, weakness.  Pt denies VB, LOF and reports good FM.  Current pregnancy problems? Hx PTL/PTD @28wks  Blood Type? Unknown Allergies? NKDA Current medications? Makena, PNVs Current PNC & next appt? [facility], one week for Makena, 2 weeks for OB  Pt's husband present for entire visit.   OB History    Gravida  2   Para  1   Term  0   Preterm  1   AB  0   Living  1     SAB  0   TAB  0   Ectopic  0   Multiple  0   Live Births  1           Past Medical History:  Diagnosis Date  . Pancreatitis     Past Surgical History:  Procedure Laterality Date  . CHOLECYSTECTOMY N/A 07/18/2017   Procedure: LAPAROSCOPIC CHOLECYSTECTOMY WITH INTRAOPERATIVE CHOLANGIOGRAM;  Surgeon: 09/17/2017, MD;  Location: [facility];  Service: General;  Laterality: N/A;  . ERCP Left 07/19/2017   Procedure: ENDOSCOPIC RETROGRADE CHOLANGIOPANCREATOGRAPHY (ERCP);  Surgeon: 09/18/2017, MD;   [facility];  Service: Gastroenterology;  Laterality: Left;    No family history on file.  Social History   Tobacco Use  . Smoking status: Never Smoker  . Smokeless tobacco: Never Used  Substance Use Topics  . Alcohol use: Not Currently  . Drug use: Yes    Allergies: No Known Allergies  Medications Prior to Admission  Medication Sig Dispense Refill Last Dose  . Prenatal Vit-Fe Fumarate-FA (MULTIVITAMIN-PRENATAL) 27-0.8 MG TABS tablet Take 1 tablet by mouth daily at 12 noon.   09/28/2019 at 1200  . nitrofurantoin, macrocrystal-monohydrate, (MACROBID) 100 MG capsule Take 1 capsule (100 mg total) by mouth 2 (two) times daily. 14 capsule 0   . promethazine (PHENERGAN) 25 MG tablet Take 1 tablet (25 mg total) by mouth every 6 (six) hours as needed for nausea or vomiting. 30 tablet 2   . traMADol (ULTRAM) 50 MG tablet Take 1-2 tablets (50-100 mg total) by mouth every 6 (six) hours as needed for severe pain. 20 tablet 0     Review of Systems  Constitutional: Negative for chills, diaphoresis, fatigue and fever.  Eyes: Negative for visual disturbance.  Respiratory: Negative for shortness of breath.   Cardiovascular: Negative for chest pain.  Gastrointestinal: Negative for abdominal pain, constipation, diarrhea, nausea and vomiting.  Genitourinary: Negative for dysuria, flank pain, frequency, pelvic pain, urgency, vaginal bleeding and vaginal discharge.  Neurological: Negative for dizziness, weakness, light-headedness and headaches.   Physical Exam   Blood pressure 138/71, pulse 98, temperature 98.4 F (36.9 C), temperature source Oral, resp. rate 18, weight 84.5 kg, last menstrual period 02/28/2019, SpO2 99 %.  Patient Vitals for the past 24 hrs:  BP Temp Temp src Pulse Resp SpO2 Weight  09/29/19 1535 138/71 - - 98 18 - -  09/29/19 1520 - - - - - 99 % -  09/29/19 1500 - - - - - 98 % -  09/29/19 1430 - - - - - 99 % -  09/29/19 1400 - - - - - 98 % -  09/29/19 1348 - - -  - - 99 % -  09/29/19 1150 111/60 - - 92 - - -  09/29/19 1130 133/83 98.4 F (36.9 C) Oral (!) 109 18 - -  09/29/19 1100 - - - - - - 84.5 kg   Physical Exam  Constitutional: She is oriented to person, place, and time. She appears well-developed and well-nourished. No distress.  HENT:  Head: Normocephalic and atraumatic.  Respiratory: Effort normal.  GI: Soft.  Genitourinary: There is no rash, tenderness or lesion on the right labia. There is no rash, tenderness or lesion on the left labia.  Neurological: She is alert and oriented to person, place, and time.  Skin: Skin is warm and dry. She is not diaphoretic.  Psychiatric: She has a normal mood and affect. Her behavior is normal. Judgment and thought content normal.   Results for orders placed or performed during the hospital encounter of 09/29/19 (from the past 24 hour(s))  Urinalysis, Routine w reflex microscopic     Status: None   Collection Time: 09/29/19 12:45 PM  Result Value Ref Range   Color, Urine YELLOW YELLOW   APPearance CLEAR CLEAR   Specific Gravity, Urine 1.008 1.005 - 1.030   pH 6.0 5.0 - 8.0   Glucose, UA NEGATIVE NEGATIVE mg/dL   Hgb urine dipstick NEGATIVE NEGATIVE   Bilirubin Urine NEGATIVE NEGATIVE   Ketones, ur NEGATIVE NEGATIVE mg/dL   Protein, ur NEGATIVE NEGATIVE mg/dL   Nitrite NEGATIVE NEGATIVE   Leukocytes,Ua NEGATIVE NEGATIVE   MAU Course  Procedures  MDM -r/o PTL -while speaking with patient, RN Druebbisch came and reported to provider that Dr. Estanislado Pandyivard had called to order IV fluids, betamethasone, and to check the patient's cervix "only if necessary." If IV fluids not sufficient, then Dr. Estanislado Pandyivard requesting Procardia. Dr. Estanislado Pandyivard also reported to the RN that the patient was already swabbed for vaginal infections in the office today and diagnosed with BV and does not need to have these services repeated in MAU. -consulted with Dr. Macon LargeAnyanwu regarding betamethasone. Per Dr. Macon LargeAnyanwu, OK to give  betamethasone to patient given symptoms and history of PTD @28wks . -fFN unable to be performed d/t cervical check in the office this morning -UA: WNL -EFM: reactive       -baseline: 150/160 (fetal tachycarida present from 1232 - 1244)       -variability: minimal/moderate       -accels: present, 10x10       -decels: present, few variables       -TOCO: irregular, frequent ctx approximately q1-393min -betamethasone given -1L LR bolus given. After bolus pt reports she has felt only 1-2ctx since finishing fluid bolus and ctx on monitor have almost entirely resolved -BPP: 8/8 -consulted with Dr. Macon LargeAnyanwu @244PM . Requested Dr. Macon LargeAnyanwu to review tracing. Per Dr. Macon LargeAnyanwu, tracing is reassuring, but will keep  patient on the monitor for another 20-36minutes. If NST remains unchanged from previous tracing or looks better, pt OK to be discharged home. If cervix remains unchanged on recheck @3PM , pt OK to be discharged home, but will need appointment in the office tomorrow for reevaluation of cervix and additional betamethasone. -cervical recheck: unchanged -prior to discharge, called and spoke with Dr. Harolyn Rutherford again about fetal tracing. Per Dr. Harolyn Rutherford, fetal tracing is reassuring and pt OK to be discharged home per above. -pt discharged to home in stable condition  Orders Placed This Encounter  Procedures  . [place] MFM FETAL BPP WO NON STRESS    Standing Status:   Standing    Number of Occurrences:   1    Order Specific Question:   Symptom/Reason for Exam    Answer:   NST (non-stress test) reactive [[id number]]  . Urinalysis, Routine w reflex microscopic    Standing Status:   Standing    Number of Occurrences:   1  . Insert peripheral IV    Standing Status:   Standing    Number of Occurrences:   1  . Discharge patient    Order Specific Question:   Discharge disposition    Answer:   01-Home or Self Care [1]    Order Specific Question:   Discharge patient date    Answer:   09/29/2019   Meds ordered this  encounter  Medications  . lactated ringers bolus 1,000 mL  . betamethasone acetate-betamethasone sodium phosphate (CELESTONE) injection 12 mg   Assessment and Plan   1. Preterm uterine contractions   2. NST (non-stress test) reactive   3. [redacted] weeks gestation of pregnancy    -called and spoke with Dr. Rivard@335PM  to advise that patient have f/u appt in office around 1pm tomorrow for sx/cervical recheck and repeat betamethasone. Per Dr. Eden Emms, patient will not be seen in the office tomorrow for repeat exam and requests that patient report to MAU only for repeat betamethasone and NST. Per Dr. Cletis Media, [facility] does not have betamethasone in the office, and she would prefer that the patient only make one stop tomorrow. Dr. Cletis Media to enter order for NST for tomorrow. -discussed s/sx of PTL, return MAU precautions -pt discharged to home in stable condition  Cletis Media Sylas Twombly 09/29/2019, 3:50 PM

## 2019-09-29 NOTE — Discharge Instructions (Signed)
Fetal Movement Counts °Patient Name: ________________________________________________ Patient Due Date: ____________________ °What is a fetal movement count? ° °A fetal movement count is the number of times that you feel your baby move during a certain amount of time. This may also be called a fetal kick count. A fetal movement count is recommended for every pregnant woman. You may be asked to start counting fetal movements as early as week 28 of your pregnancy. °Pay attention to when your baby is most active. You may notice your baby's sleep and wake cycles. You may also notice things that make your baby move more. You should do a fetal movement count: °· When your baby is normally most active. °· At the same time each day. °A good time to count movements is while you are resting, after having something to eat and drink. °How do I count fetal movements? °1. Find a quiet, comfortable area. Sit, or lie down on your side. °2. Write down the date, the start time and stop time, and the number of movements that you felt between those two times. Take this information with you to your health care visits. °3. For 2 hours, count kicks, flutters, swishes, rolls, and jabs. You should feel at least 10 movements during 2 hours. °4. You may stop counting after you have felt 10 movements. °5. If you do not feel 10 movements in 2 hours, have something to eat and drink. Then, keep resting and counting for 1 hour. If you feel at least 4 movements during that hour, you may stop counting. °Contact a health care provider if: °· You feel fewer than 4 movements in 2 hours. °· Your baby is not moving like he or she usually does. °Date: ____________ Start time: ____________ Stop time: ____________ Movements: ____________ °Date: ____________ Start time: ____________ Stop time: ____________ Movements: ____________ °Date: ____________ Start time: ____________ Stop time: ____________ Movements: ____________ °Date: ____________ Start time:  ____________ Stop time: ____________ Movements: ____________ °Date: ____________ Start time: ____________ Stop time: ____________ Movements: ____________ °Date: ____________ Start time: ____________ Stop time: ____________ Movements: ____________ °Date: ____________ Start time: ____________ Stop time: ____________ Movements: ____________ °Date: ____________ Start time: ____________ Stop time: ____________ Movements: ____________ °Date: ____________ Start time: ____________ Stop time: ____________ Movements: ____________ °This information is not intended to replace advice given to you by your health care provider. Make sure you discuss any questions you have with your health care provider. °Document Released: 11/28/2006 Document Revised: 11/18/2018 Document Reviewed: 12/08/2015 °Elsevier Patient Education © 2020 Else[employer] Inc[employer] °Preterm Labor and Birth Information ° °The normal length of a pregnancy is 39-41 weeks. Preterm labor is when labor starts before 37 completed weeks of pregnancy. °What are the risk factors for preterm labor? °Preterm labor is more likely to occur in women who: °· Have certain infections during pregnancy such as a bladder infection, sexually transmitted infection, or infection inside the uterus (chorioamnionitis). °· Have a shorter-than-normal cervix. °· Have gone into preterm labor before. °· Have had surgery on their cervix. °· Are younger than age 17 or older than age 35. °· Are African American. °· Are pregnant with twins or multiple babies (multiple gestation). °· Take street drugs or smoke while pregnant. °· Do not gain enough weight while pregnant. °· Became pregnant shortly after having been pregnant. °What are the symptoms of preterm labor? °Symptoms of preterm labor include: °· Cramps similar to those that can happen during a menstrual period. The cramps may happen with diarrhea. °· Pain in the abdomen or lower   back. °· Regular uterine contractions that may feel like tightening of the  abdomen. °· A feeling of increased pressure in the pelvis. °· Increased watery or bloody mucus discharge from the vagina. °· Water breaking (ruptured amniotic sac). °Why is it important to recognize signs of preterm labor? °It is important to recognize signs of preterm labor because babies who are born prematurely may not be fully developed. This can put them at an increased risk for: °· Long-term (chronic) heart and lung problems. °· Difficulty immediately after birth with regulating body systems, including blood sugar, body temperature, heart rate, and breathing rate. °· Bleeding in the brain. °· Cerebral palsy. °· Learning difficulties. °· Death. °These risks are highest for babies who are born before 34 weeks of pregnancy. °How is preterm labor treated? °Treatment depends on the length of your pregnancy, your condition, and the health of your baby. It may involve: °· Having a stitch (suture) placed in your cervix to prevent your cervix from opening too early (cerclage). °· Taking or being given medicines, such as: °? Hormone medicines. These may be given early in pregnancy to help support the pregnancy. °? Medicine to stop contractions. °? Medicines to help mature the baby’s lungs. These may be prescribed if the risk of delivery is high. °? Medicines to prevent your baby from developing cerebral palsy. °If the labor happens before 34 weeks of pregnancy, you may need to stay in the hospital. °What should I do if I think I am in preterm labor? °If you think that you are going into preterm labor, call your health care provider right away. °How can I prevent preterm labor in future pregnancies? °To increase your chance of having a full-term pregnancy: °· Do not use any tobacco products, such as cigarettes, chewing tobacco, and e-cigarettes. If you need help quitting, ask your health care provider. °· Do not use street drugs or medicines that have not been prescribed to you during your pregnancy. °· Talk with your  health care provider before taking any herbal supplements, even if you have been taking them regularly. °· Make sure you gain a healthy amount of weight during your pregnancy. °· Watch for infection. If you think that you might have an infection, get it checked right away. °· Make sure to tell your health care provider if you have gone into preterm labor before. °This information is not intended to replace advice given to you by your health care provider. Make sure you discuss any questions you have with your health care provider. °Document Released: 01/19/2004 Document Revised: 02/20/2019 Document Reviewed: 03/21/2016 °Elsevier Patient Education © 2020 Elsevier Inc. ° °

## 2019-09-30 ENCOUNTER — Inpatient Hospital Stay (HOSPITAL_COMMUNITY)
Admission: AD | Admit: 2019-09-30 | Discharge: 2019-09-30 | Disposition: A | Discharge status: Home / Self Care | Payer: BC Managed Care – PPO | Attending: Obstetrics & Gynecology | Admitting: Obstetrics & Gynecology

## 2019-09-30 ENCOUNTER — Encounter (HOSPITAL_COMMUNITY): Payer: Self-pay | Admitting: *Deleted

## 2019-09-30 ENCOUNTER — Other Ambulatory Visit: Payer: Self-pay

## 2019-09-30 DIAGNOSIS — O4700 False labor before 37 completed weeks of gestation, unspecified trimester: Secondary | ICD-10-CM | POA: Diagnosis not present

## 2019-09-30 DIAGNOSIS — O4703 False labor before 37 completed weeks of gestation, third trimester: Secondary | ICD-10-CM | POA: Diagnosis not present

## 2019-09-30 DIAGNOSIS — O09523 Supervision of elderly multigravida, third trimester: Secondary | ICD-10-CM | POA: Diagnosis not present

## 2019-09-30 DIAGNOSIS — Z79899 Other long term (current) drug therapy: Secondary | ICD-10-CM | POA: Diagnosis not present

## 2019-09-30 DIAGNOSIS — Z3689 Encounter for other specified antenatal screening: Secondary | ICD-10-CM | POA: Diagnosis not present

## 2019-09-30 DIAGNOSIS — Z3A30 30 weeks gestation of pregnancy: Secondary | ICD-10-CM | POA: Diagnosis not present

## 2019-09-30 MED ORDER — BETAMETHASONE SOD PHOS & ACET 6 (3-3) MG/ML IJ SUSP
12.0000 mg | Freq: Once | INTRAMUSCULAR | Status: AC
  Administered 2019-09-30: 12 mg via INTRAMUSCULAR

## 2019-09-30 NOTE — MAU Note (Signed)
Pt presents for NST and BMZ injection.  Received 1st dose Betamethasone yesterday afternoon.  Denies VB or LOF.  Reports +FM, less than usual.

## 2019-09-30 NOTE — MAU Provider Note (Signed)
History     CSN: [id number]  Arrival date and time: 09/30/19 1241   None     Chief Complaint  Patient presents with  . NST  . BMZ injection   HPI   Olivia Gordon is a 33 y.o. female G64P0101 @ [redacted]w[redacted]d here in MAU for her second dose of BMZ, she was also told she needed an NST today. + fetal movement. No contractions at this time.   OB History    Gravida  2   Para  1   Term  0   Preterm  1   AB  0   Living  1     SAB  0   TAB  0   Ectopic  0   Multiple  0   Live Births  1           Past Medical History:  Diagnosis Date  . Pancreatitis     Past Surgical History:  Procedure Laterality Date  . CHOLECYSTECTOMY N/A 07/18/2017   Procedure: LAPAROSCOPIC CHOLECYSTECTOMY WITH INTRAOPERATIVE CHOLANGIOGRAM;  Surgeon: Armandina Gemma, MD;  Location: [facility];  Service: General;  Laterality: N/A;  . ERCP Left 07/19/2017   Procedure: ENDOSCOPIC RETROGRADE CHOLANGIOPANCREATOGRAPHY (ERCP);  Surgeon: Ronnette Juniper, MD;  Location: [facility];  Service: Gastroenterology;  Laterality: Left;    Family History  Problem Relation Age of Onset  . Healthy Mother   . Healthy Father     Social History   Tobacco Use  . Smoking status: Never Smoker  . Smokeless tobacco: Never Used  Substance Use Topics  . Alcohol use: Not Currently  . Drug use: Yes    Allergies: No Known Allergies  Medications Prior to Admission  Medication Sig Dispense Refill Last Dose  . Prenatal Vit-Fe Fumarate-FA (MULTIVITAMIN-PRENATAL) 27-0.8 MG TABS tablet Take 1 tablet by mouth daily at 12 noon.   09/30/2019 at 1200  . nitrofurantoin, macrocrystal-monohydrate, (MACROBID) 100 MG capsule Take 1 capsule (100 mg total) by mouth 2 (two) times daily. 14 capsule 0   . promethazine (PHENERGAN) 25 MG tablet Take 1 tablet (25 mg total) by mouth every 6 (six) hours as needed for nausea or vomiting. 30 tablet 2   . traMADol (ULTRAM) 50 MG tablet Take 1-2 tablets (50-100 mg total) by mouth every 6 (six)  hours as needed for severe pain. 20 tablet 0    Results for orders placed or performed during the hospital encounter of 09/29/19 (from the past 48 hour(s))  Urinalysis, Routine w reflex microscopic     Status: None   Collection Time: 09/29/19 12:45 PM  Result Value Ref Range   Color, Urine YELLOW YELLOW   APPearance CLEAR CLEAR   Specific Gravity, Urine 1.008 1.005 - 1.030   pH 6.0 5.0 - 8.0   Glucose, UA NEGATIVE NEGATIVE mg/dL   Hgb urine dipstick NEGATIVE NEGATIVE   Bilirubin Urine NEGATIVE NEGATIVE   Ketones, ur NEGATIVE NEGATIVE mg/dL   Protein, ur NEGATIVE NEGATIVE mg/dL   Nitrite NEGATIVE NEGATIVE   Leukocytes,Ua NEGATIVE NEGATIVE    Comment: Performed at [facility] [address], [place], [place] [address]   Review of Systems  Gastrointestinal: Negative for abdominal pain.  Genitourinary: Negative for vaginal bleeding.   Physical Exam   Blood pressure 131/73, pulse 98, temperature 98.1 F (36.7 C), temperature source Oral, resp. rate 20, height 5\' 5"  (1.651 m), weight 85.8 kg, last menstrual period 02/28/2019, SpO2 98 %.  Physical Exam  Constitutional: She is oriented to  person, place, and time. She appears well-developed and well-nourished. No distress.  Respiratory: Effort normal.  Musculoskeletal: Normal range of motion.  Neurological: She is alert and oriented to person, place, and time.  Skin: She is not diaphoretic.  Psychiatric: Her behavior is normal.   Fetal Tracing: Baseline: 145 bpm Variability: Moderate  Accelerations: 15x15 Decelerations: None Toco: None   MAU Course  Procedures  MDM  NST reactive  BMZ # 2 given   Assessment and Plan   A:  1. NST (non-stress test) reactive   2. Threatened premature labor, antepartum   3. [redacted] weeks gestation of pregnancy     P:  Discharge home with strict return precautions F/u with OB as scheduled Preterm labor precautions Pelvic rest Increase oral fluid intake.   Duane Lope,  NP 09/30/2019 6:57 PM

## 2019-09-30 NOTE — Discharge Instructions (Signed)
Braxton Hicks Contractions Contractions of the uterus can occur throughout pregnancy, but they are not always a sign that you are in labor. You may have practice contractions called Braxton Hicks contractions. These false labor contractions are sometimes confused with true labor. What are Braxton Hicks contractions? Braxton Hicks contractions are tightening movements that occur in the muscles of the uterus before labor. Unlike true labor contractions, these contractions do not result in opening (dilation) and thinning of the cervix. Toward the end of pregnancy (32-34 weeks), Braxton Hicks contractions can happen more often and may become stronger. These contractions are sometimes difficult to tell apart from true labor because they can be very uncomfortable. You should not feel embarrassed if you go to the hospital with false labor. Sometimes, the only way to tell if you are in true labor is for your health care provider to look for changes in the cervix. The health care provider will do a physical exam and may monitor your contractions. If you are not in true labor, the exam should show that your cervix is not dilating and your water has not broken. If there are no other health problems associated with your pregnancy, it is completely safe for you to be sent home with false labor. You may continue to have Braxton Hicks contractions until you go into true labor. How to tell the difference between true labor and false labor True labor  Contractions last 30-70 seconds.  Contractions become very regular.  Discomfort is usually felt in the top of the uterus, and it spreads to the lower abdomen and low back.  Contractions do not go away with walking.  Contractions usually become more intense and increase in frequency.  The cervix dilates and gets thinner. False labor  Contractions are usually shorter and not as strong as true labor contractions.  Contractions are usually irregular.  Contractions  are often felt in the front of the lower abdomen and in the groin.  Contractions may go away when you walk around or change positions while lying down.  Contractions get weaker and are shorter-lasting as time goes on.  The cervix usually does not dilate or become thin. Follow these instructions at home:   Take over-the-counter and prescription medicines only as told by your health care provider.  Keep up with your usual exercises and follow other instructions from your health care provider.  Eat and drink lightly if you think you are going into labor.  If Braxton Hicks contractions are making you uncomfortable: ? Change your position from lying down or resting to walking, or change from walking to resting. ? Sit and rest in a tub of warm water. ? Drink enough fluid to keep your urine pale yellow. Dehydration may cause these contractions. ? Do slow and deep breathing several times an hour.  Keep all follow-up prenatal visits as told by your health care provider. This is important. Contact a health care provider if:  You have a fever.  You have continuous pain in your abdomen. Get help right away if:  Your contractions become stronger, more regular, and closer together.  You have fluid leaking or gushing from your vagina.  You pass blood-tinged mucus (bloody show).  You have bleeding from your vagina.  You have low back pain that you never had before.  You feel your baby's head pushing down and causing pelvic pressure.  Your baby is not moving inside you as much as it used to. Summary  Contractions that occur before labor are   called Braxton Hicks contractions, false labor, or practice contractions.  Braxton Hicks contractions are usually shorter, weaker, farther apart, and less regular than true labor contractions. True labor contractions usually become progressively stronger and regular, and they become more frequent.  Manage discomfort from Braxton Hicks contractions  by changing position, resting in a warm bath, drinking plenty of water, or practicing deep breathing. This information is not intended to replace advice given to you by your health care provider. Make sure you discuss any questions you have with your health care provider. Document Released: 03/14/2017 Document Revised: 10/11/2017 Document Reviewed: 03/14/2017 Elsevier Patient Education  2020 Elsevier Inc.  

## 2019-10-12 ENCOUNTER — Inpatient Hospital Stay (EMERGENCY_DEPARTMENT_HOSPITAL)
Admission: AD | Admit: 2019-10-12 | Discharge: 2019-10-13 | Disposition: A | Discharge status: Home / Self Care | Payer: BC Managed Care – PPO | Source: Home / Self Care | Attending: Obstetrics and Gynecology | Admitting: Obstetrics and Gynecology

## 2019-10-12 ENCOUNTER — Encounter (HOSPITAL_COMMUNITY): Payer: Self-pay

## 2019-10-12 ENCOUNTER — Other Ambulatory Visit: Payer: Self-pay

## 2019-10-12 DIAGNOSIS — R7989 Other specified abnormal findings of blood chemistry: Secondary | ICD-10-CM | POA: Clinically undetermined

## 2019-10-12 DIAGNOSIS — O09523 Supervision of elderly multigravida, third trimester: Secondary | ICD-10-CM | POA: Insufficient documentation

## 2019-10-12 DIAGNOSIS — R82998 Other abnormal findings in urine: Secondary | ICD-10-CM

## 2019-10-12 DIAGNOSIS — O09893 Supervision of other high risk pregnancies, third trimester: Secondary | ICD-10-CM | POA: Insufficient documentation

## 2019-10-12 DIAGNOSIS — Z9049 Acquired absence of other specified parts of digestive tract: Secondary | ICD-10-CM | POA: Insufficient documentation

## 2019-10-12 DIAGNOSIS — Z79899 Other long term (current) drug therapy: Secondary | ICD-10-CM | POA: Insufficient documentation

## 2019-10-12 DIAGNOSIS — O99891 Other specified diseases and conditions complicating pregnancy: Secondary | ICD-10-CM | POA: Insufficient documentation

## 2019-10-12 DIAGNOSIS — Z3A32 32 weeks gestation of pregnancy: Secondary | ICD-10-CM | POA: Insufficient documentation

## 2019-10-12 DIAGNOSIS — R8789 Other abnormal findings in specimens from female genital organs: Secondary | ICD-10-CM

## 2019-10-12 DIAGNOSIS — R945 Abnormal results of liver function studies: Secondary | ICD-10-CM | POA: Clinically undetermined

## 2019-10-12 LAB — URINALYSIS, ROUTINE W REFLEX MICROSCOPIC
Bilirubin Urine: NEGATIVE
Glucose, UA: NEGATIVE mg/dL
Hgb urine dipstick: NEGATIVE
Ketones, ur: NEGATIVE mg/dL
Nitrite: NEGATIVE
Protein, ur: NEGATIVE mg/dL
Specific Gravity, Urine: 1.005 (ref 1.005–1.030)
pH: 7 (ref 5.0–8.0)

## 2019-10-12 LAB — COMPREHENSIVE METABOLIC PANEL
ALT: 62 U/L — ABNORMAL HIGH (ref 0–44)
AST: 39 U/L (ref 15–41)
Albumin: 2.9 g/dL — ABNORMAL LOW (ref 3.5–5.0)
Alkaline Phosphatase: 119 U/L (ref 38–126)
Anion gap: 8 (ref 5–15)
BUN: <5 mg/dL — ABNORMAL LOW (ref 6–20)
CO2: 21 mmol/L — ABNORMAL LOW (ref 22–32)
Calcium: 8.7 mg/dL — ABNORMAL LOW (ref 8.9–10.3)
Chloride: 108 mmol/L (ref 98–111)
Creatinine, Ser: 0.49 mg/dL (ref 0.44–1.00)
GFR calc Af Amer: >60 mL/min (ref >60)
GFR calc non Af Amer: >60 mL/min (ref >60)
Glucose, Bld: 92 mg/dL (ref 70–99)
Potassium: 3.4 mmol/L — ABNORMAL LOW (ref 3.5–5.1)
Sodium: 137 mmol/L (ref 135–145)
Total Bilirubin: 0.3 mg/dL (ref 0.3–1.2)
Total Protein: 6.4 g/dL — ABNORMAL LOW (ref 6.5–8.1)

## 2019-10-12 LAB — CBC WITH DIFFERENTIAL/PLATELET
Abs Immature Granulocytes: 0.13 10*3/uL — ABNORMAL HIGH (ref 0.00–0.07)
Basophils Absolute: 0 10*3/uL (ref 0.0–0.1)
Basophils Relative: 0 %
Eosinophils Absolute: 0.1 10*3/uL (ref 0.0–0.5)
Eosinophils Relative: 1 %
HCT: 38.6 % (ref 36.0–46.0)
Hemoglobin: 13.2 g/dL (ref 12.0–15.0)
Immature Granulocytes: 2 %
Lymphocytes Relative: 23 %
Lymphs Abs: 1.7 10*3/uL (ref 0.7–4.0)
MCH: 32.8 pg (ref 26.0–34.0)
MCHC: 34.2 g/dL (ref 30.0–36.0)
MCV: 95.8 fL (ref 80.0–100.0)
Monocytes Absolute: 0.7 10*3/uL (ref 0.1–1.0)
Monocytes Relative: 9 %
Neutro Abs: 4.9 10*3/uL (ref 1.7–7.7)
Neutrophils Relative %: 65 %
Platelets: 197 10*3/uL (ref 150–400)
RBC: 4.03 MIL/uL (ref 3.87–5.11)
RDW: 12.2 % (ref 11.5–15.5)
WBC: 7.5 10*3/uL (ref 4.0–10.5)
nRBC: 0 % (ref 0.0–0.2)

## 2019-10-12 LAB — FETAL FIBRONECTIN: Fetal Fibronectin: POSITIVE — AB

## 2019-10-12 MED ORDER — NIFEDIPINE 10 MG PO CAPS
10.0000 mg | ORAL_CAPSULE | ORAL | Status: DC | PRN
  Administered 2019-10-12 (×3): 10 mg via ORAL
  Filled 2019-10-12 (×3): qty 1

## 2019-10-12 MED ORDER — NIFEDIPINE 10 MG PO CAPS: 10.0000 mg | ORAL_CAPSULE | Freq: Four times a day (QID) | ORAL | 0 refills | Status: DC | PRN

## 2019-10-12 MED ORDER — LACTATED RINGERS IV SOLN
Freq: Once | INTRAVENOUS | Status: AC
  Administered 2019-10-12: 23:00:00 via INTRAVENOUS

## 2019-10-12 NOTE — MAU Provider Note (Signed)
Chief Complaint:  Contractions   First Provider Initiated Contact with Patient 10/12/19 2029     HPI: Olivia Gordon is a 33 y.o. G2P0101 at 2w2dwho presents to maternity admissions reporting painful contractions.   Was seen here on 11/17 for preterm contractions also.  Betamethasone series given.  She reports good fetal movement, denies LOF, vaginal bleeding, vaginal itching/burning, urinary symptoms, h/a, dizziness, n/v, diarrhea, constipation or fever/chills.    History of elevated LFTs in July, states were never rechecked (chart review later did show they did recheck them in the office, and hepatitis panel was negative)  Abdominal Pain This is a recurrent problem. The current episode started today. The onset quality is gradual. The problem occurs intermittently. The problem has been unchanged. The pain is mild. The quality of the pain is cramping. The abdominal pain does not radiate. Pertinent negatives include no constipation, diarrhea, dysuria, fever, frequency, headaches, myalgias, nausea or vomiting. Nothing aggravates the pain. The pain is relieved by nothing. She has tried nothing for the symptoms.    RN note: Patient presents to c/o painful irregular ctx that started 11/29.  Patient reports drinking water and laying on side didn't help. Patient unable to sleep d/t ctx.  +FM, denies vaginal bleeding or LOF.  Patient has received 2 doses of BMZ. No recent intercourse  Past Medical History: Past Medical History:  Diagnosis Date  . Pancreatitis     Past obstetric history: OB History  Gravida Para Term Preterm AB Living  2 1 0 1 0 1  SAB TAB Ectopic Multiple Live Births  0 0 0 0 1    # Outcome Date GA Lbr Len/2nd Weight Sex Delivery Anes PTL Lv  2 Current           1 Preterm  [redacted]w[redacted]d    Vag-Spont   LIV    Past Surgical History: Past Surgical History:  Procedure Laterality Date  . CHOLECYSTECTOMY N/A 07/18/2017   Procedure: LAPAROSCOPIC CHOLECYSTECTOMY WITH  INTRAOPERATIVE CHOLANGIOGRAM;  Surgeon: Darnell Level, MD;  Location: [facility];  Service: General;  Laterality: N/A;  . ERCP Left 07/19/2017   Procedure: ENDOSCOPIC RETROGRADE CHOLANGIOPANCREATOGRAPHY (ERCP);  Surgeon: Kerin Salen, MD;  Location: [facility];  Service: Gastroenterology;  Laterality: Left;    Family History: Family History  Problem Relation Age of Onset  . Healthy Mother   . Healthy Father     Social History: Social History   Tobacco Use  . Smoking status: Never Smoker  . Smokeless tobacco: Never Used  Substance Use Topics  . Alcohol use: Not Currently  . Drug use: Yes    Allergies: No Known Allergies  Meds:  Medications Prior to Admission  Medication Sig Dispense Refill Last Dose  . Prenatal Vit-Fe Fumarate-FA (MULTIVITAMIN-PRENATAL) 27-0.8 MG TABS tablet Take 1 tablet by mouth daily at 12 noon.   10/12/2019 at Unknown time  . nitrofurantoin, macrocrystal-monohydrate, (MACROBID) 100 MG capsule Take 1 capsule (100 mg total) by mouth 2 (two) times daily. 14 capsule 0   . promethazine (PHENERGAN) 25 MG tablet Take 1 tablet (25 mg total) by mouth every 6 (six) hours as needed for nausea or vomiting. 30 tablet 2   . traMADol (ULTRAM) 50 MG tablet Take 1-2 tablets (50-100 mg total) by mouth every 6 (six) hours as needed for severe pain. 20 tablet 0     I have reviewed patient's Past Medical Hx, Surgical Hx, Family Hx, Social Hx, medications and allergies.   ROS:  Review of Systems  Constitutional:  Negative for fever.  Gastrointestinal: Positive for abdominal pain. Negative for constipation, diarrhea, nausea and vomiting.  Genitourinary: Negative for dysuria and frequency.  Musculoskeletal: Negative for myalgias.  Neurological: Negative for headaches.   Other systems negative  Physical Exam   Patient Vitals for the past 24 hrs:  BP Pulse Resp SpO2 Height Weight  10/12/19 2007 135/80 95 16 100 % 5\' 5"  (1.651 m) 82.5 kg   Constitutional: Well-developed,  well-nourished female in no acute distress.  Cardiovascular: normal rate and rhythm Respiratory: normal effort, clear to auscultation bilaterally GI: Abd soft, non-tender, gravid appropriate for gestational age.   No rebound or guarding. MS: Extremities nontender, no edema, normal ROM Neurologic: Alert and oriented x 4.  GU: Neg CVAT.  PELVIC EXAM:    Dilation: 2 Effacement (%): 60 Station: Ballotable Presentation: Vertex Exam by:: M.Talisa Petrak,CNM Cervix is funneling  FHT:  Baseline 150 , moderate variability, accelerations present, no decelerations Contractions: q 3-4 mins Irregular     Labs: Results for orders placed or performed during the hospital encounter of 10/12/19 (from the past 24 hour(s))  CBC with Differential     Status: Abnormal   Collection Time: 10/12/19  8:29 PM  Result Value Ref Range   WBC 7.5 4.0 - 10.5 K/uL   RBC 4.03 3.87 - 5.11 MIL/uL   Hemoglobin 13.2 12.0 - 15.0 g/dL   HCT 16.138.6 09.636.0 - 04.546.0 %   MCV 95.8 80.0 - 100.0 fL   MCH 32.8 26.0 - 34.0 pg   MCHC 34.2 30.0 - 36.0 g/dL   RDW 40.912.2 81.111.5 - 91.415.5 %   Platelets 197 150 - 400 K/uL   nRBC 0.0 0.0 - 0.2 %   Neutrophils Relative % 65 %   Neutro Abs 4.9 1.7 - 7.7 K/uL   Lymphocytes Relative 23 %   Lymphs Abs 1.7 0.7 - 4.0 K/uL   Monocytes Relative 9 %   Monocytes Absolute 0.7 0.1 - 1.0 K/uL   Eosinophils Relative 1 %   Eosinophils Absolute 0.1 0.0 - 0.5 K/uL   Basophils Relative 0 %   Basophils Absolute 0.0 0.0 - 0.1 K/uL   Immature Granulocytes 2 %   Abs Immature Granulocytes 0.13 (H) 0.00 - 0.07 K/uL  Comprehensive metabolic panel     Status: Abnormal   Collection Time: 10/12/19  8:29 PM  Result Value Ref Range   Sodium 137 135 - 145 mmol/L   Potassium 3.4 (L) 3.5 - 5.1 mmol/L   Chloride 108 98 - 111 mmol/L   CO2 21 (L) 22 - 32 mmol/L   Glucose, Bld 92 70 - 99 mg/dL   BUN <5 (L) 6 - 20 mg/dL   Creatinine, Ser 7.820.49 0.44 - 1.00 mg/dL   Calcium 8.7 (L) 8.9 - 10.3 mg/dL   Total Protein 6.4 (L)  6.5 - 8.1 g/dL   Albumin 2.9 (L) 3.5 - 5.0 g/dL   AST 39 15 - 41 U/L   ALT 62 (H) 0 - 44 U/L   Alkaline Phosphatase 119 38 - 126 U/L   Total Bilirubin 0.3 0.3 - 1.2 mg/dL   GFR calc non Af Amer >60 >60 mL/min   GFR calc Af Amer >60 >60 mL/min   Anion gap 8 5 - 15  Fetal fibronectin     Status: Abnormal   Collection Time: 10/12/19  8:42 PM  Result Value Ref Range   Fetal Fibronectin POSITIVE (A) NEGATIVE  Urinalysis, Routine w reflex microscopic     Status: Abnormal  Collection Time: 10/12/19  8:42 PM  Result Value Ref Range   Color, Urine YELLOW YELLOW   APPearance CLEAR CLEAR   Specific Gravity, Urine 1.005 1.005 - 1.030   pH 7.0 5.0 - 8.0   Glucose, UA NEGATIVE NEGATIVE mg/dL   Hgb urine dipstick NEGATIVE NEGATIVE   Bilirubin Urine NEGATIVE NEGATIVE   Ketones, ur NEGATIVE NEGATIVE mg/dL   Protein, ur NEGATIVE NEGATIVE mg/dL   Nitrite NEGATIVE NEGATIVE   Leukocytes,Ua MODERATE (A) NEGATIVE   RBC / HPF 0-5 0 - 5 RBC/hpf   WBC, UA 21-50 0 - 5 WBC/hpf   Bacteria, UA RARE (A) NONE SEEN   Squamous Epithelial / LPF 6-10 0 - 5       Imaging:    MAU Course/MDM: I have ordered labs and reviewed results. Labs resulted as normal except fetal fibronectin which is positive Discussed this does not absolutely mean she will deliver early, but bears scrutiny with any PTL.   She already had Betamethasone series on prior visit.   NST reviewed, reactive Procardia series given x 3 doses with improvement in contractions.  Not feeling them anymore.  Cervix rechecked and is unchanged Discussed will send her home and she has an appt in 2 days Strict preterm labor precautions reviewed Will Rx Short acting Procardia for prn use at home. If has more than 6 UCs/hr, take one.  If they do not space out she is to call her doctor for further instructions.     Assessment: Single intrauterine pregnancy at [redacted]w[redacted]d Preterm labor Positive fetal fibronectin  Plan: Discharge home Preterm Labor  precautions and fetal kick counts Follow up in Office for prenatal visits and recheck of status  Encouraged to return here or to other Urgent Care/ED if she develops worsening of symptoms, increase in pain, fever, or other concerning symptoms.   Pt stable at time of discharge.  Hansel Feinstein CNM, MSN Certified Nurse-Midwife 10/12/2019 8:29 PM

## 2019-10-12 NOTE — Discharge Instructions (Signed)
Fetal Fibronectin Test Why am I having this test? Fetal fibronectin (fFN) is a protein that your body makes during pregnancy. Having fFN in your vaginal fluid between 22 and 36 weeks of pregnancy could be a warning sign that your baby will be born early (prematurely). Babies born prematurely, or before 37 weeks, may have trouble breathing or feeding. You may have this test if you have symptoms of premature labor, such as:  Contractions.  More vaginal discharge.  Backache. What is being tested? This test checks the level of fFN in your vaginal fluid. What kind of sample is taken?  This test requires a sample of fluid from inside your vagina. This sample is collected by your health care provider using a cotton swab. How do I prepare for this test?  For 24 hours before the test, do not have sex or put anything into your vagina. Tell a health care provider about:  Any allergies you have.  Any medical conditions you have, especially any vaginal yeast infections or any symptoms of a yeast infection, including: ? Itchiness. ? Soreness. ? Unusual discharge. How are the results reported? Your results will be reported as positive or negative for fFN. If positive, results may also be given as micrograms of fFN per milliliter of vaginal fluid (mcg/mL). A result of 0.05 mcg/mL or less is considered negative. A false-positive result can occur. A false positive is incorrect because it means that a condition is present when it is not. A number of factors may lead to false-positive results, including vaginal bleeding, recent sexual intercourse, or a recent cervical exam. Your health care provider will talk to you about doing more tests to confirm your results. What do the results mean? A negative result means that no fFN was found in your vaginal fluid, meaning that premature delivery during the next 2 weeks is very unlikely. If you are still having symptoms of early labor, you may need to have this  test again in two weeks. A positive result means that fFN was found in your vaginal fluid. This means that your risk for premature labor is greater, but it does not mean that you will go into early labor. Your health care provider may do other tests and exams to closely monitor your pregnancy. Talk with your health care provider about what your results mean. Questions to ask your health care provider Ask your health care provider, or the department that is doing the test:  When will my results be ready?  How will I get my results?  What are my treatment options?  What other tests do I need?  What are my next steps? Summary  Fetal fibronectin (fFN) is a protein that your body makes during pregnancy.  Having fFN in your vaginal fluid between 22 and 36 weeks of pregnancy could be a warning sign that your baby will be born early (prematurely).  A negative result means that no fFN was found in your vaginal fluid, meaning that premature delivery over the next 2 weeks is very unlikely. This information is not intended to replace advice given to you by your health care provider. Make sure you discuss any questions you have with your health care provider. Document Released: 08/30/2004 Document Revised: 06/20/2017 Document Reviewed: 06/20/2017 Elsevier Patient Education  2020 [employer]. Preterm Labor and Birth Information Pregnancy normally lasts 39-41 weeks. Preterm labor is when labor starts early. It starts before you have been pregnant for 37 whole weeks. What are the risk factors for  preterm labor? Preterm labor is more likely to occur in women who:  Have an infection while pregnant.  Have a cervix that is short.  Have gone into preterm labor before.  Have had surgery on their cervix.  Are younger than age 48.  Are older than age 69.  Are African American.  Are pregnant with two or more babies.  Take street drugs while pregnant.  Smoke while pregnant.  Do not gain  enough weight while pregnant.  Got pregnant right after another pregnancy. What are the symptoms of preterm labor? Symptoms of preterm labor include:  Cramps. The cramps may feel like the cramps some women get during their period. The cramps may happen with watery poop (diarrhea).  Pain in the belly (abdomen).  Pain in the lower back.  Regular contractions or tightening. It may feel like your belly is getting tighter.  Pressure in the lower belly that seems to get stronger.  More fluid (discharge) leaking from the vagina. The fluid may be watery or bloody.  Water breaking. Why is it important to notice signs of preterm labor? Babies who are born early may not be fully developed. They have a higher chance for:  Long-term heart problems.  Long-term lung problems.  Trouble controlling body systems, like breathing.  Bleeding in the brain.  A condition called cerebral palsy.  Learning difficulties.  Death. These risks are highest for babies who are born before 59 weeks of pregnancy. How is preterm labor treated? Treatment depends on:  How long you were pregnant.  Your condition.  The health of your baby. Treatment may involve:  Having a stitch (suture) placed in your cervix. When you give birth, your cervix opens so the baby can come out. The stitch keeps the cervix from opening too soon.  Staying at the hospital.  Taking or getting medicines, such as: ? Hormone medicines. ? Medicines to stop contractions. ? Medicines to help the babys lungs develop. ? Medicines to prevent your baby from having cerebral palsy. What should I do if I am in preterm labor? If you think you are going into labor too soon, call your doctor right away. How can I prevent preterm labor?  Do not use any tobacco products. ? Examples of these are cigarettes, chewing tobacco, and e-cigarettes. ? If you need help quitting, ask your doctor.  Do not use street drugs.  Do not use any  medicines unless you ask your doctor if they are safe for you.  Talk with your doctor before taking any herbal supplements.  Make sure you gain enough weight.  Watch for infection. If you think you might have an infection, get it checked right away.  If you have gone into preterm labor before, tell your doctor. This information is not intended to replace advice given to you by your health care provider. Make sure you discuss any questions you have with your health care provider. Document Released: 01/25/2009 Document Revised: 02/20/2019 Document Reviewed: 03/21/2016 Elsevier Patient Education  2020 [employer].

## 2019-10-12 NOTE — MAU Note (Signed)
Patient presents to c/o painful irregular ctx that started 11/29.  Patient reports drinking water and laying on side didn't help. Patient unable to sleep d/t ctx.  +FM, denies vaginal bleeding or LOF.  Patient has received 2 doses of BMZ. No recent intercourse.

## 2019-10-13 NOTE — MAU Note (Signed)
Patient signed paper form.

## 2019-10-14 ENCOUNTER — Inpatient Hospital Stay (HOSPITAL_COMMUNITY)
Admission: AD | Admit: 2019-10-14 | Discharge: 2019-10-17 | DRG: 805 | Disposition: A | Discharge status: Home / Self Care | Payer: BC Managed Care – PPO | Attending: Obstetrics and Gynecology | Admitting: Obstetrics and Gynecology

## 2019-10-14 ENCOUNTER — Encounter (HOSPITAL_COMMUNITY): Payer: Self-pay

## 2019-10-14 ENCOUNTER — Other Ambulatory Visit: Payer: Self-pay

## 2019-10-14 DIAGNOSIS — Z3A32 32 weeks gestation of pregnancy: Secondary | ICD-10-CM

## 2019-10-14 DIAGNOSIS — O42913 Preterm premature rupture of membranes, unspecified as to length of time between rupture and onset of labor, third trimester: Principal | ICD-10-CM | POA: Diagnosis present

## 2019-10-14 DIAGNOSIS — Z8751 Personal history of pre-term labor: Secondary | ICD-10-CM

## 2019-10-14 DIAGNOSIS — O09293 Supervision of pregnancy with other poor reproductive or obstetric history, third trimester: Secondary | ICD-10-CM | POA: Diagnosis not present

## 2019-10-14 DIAGNOSIS — O99824 Streptococcus B carrier state complicating childbirth: Secondary | ICD-10-CM | POA: Diagnosis present

## 2019-10-14 DIAGNOSIS — Z20828 Contact with and (suspected) exposure to other viral communicable diseases: Secondary | ICD-10-CM | POA: Diagnosis present

## 2019-10-14 DIAGNOSIS — N764 Abscess of vulva: Secondary | ICD-10-CM | POA: Diagnosis present

## 2019-10-14 DIAGNOSIS — O42113 Preterm premature rupture of membranes, onset of labor more than 24 hours following rupture, third trimester: Secondary | ICD-10-CM | POA: Diagnosis not present

## 2019-10-14 DIAGNOSIS — O09523 Supervision of elderly multigravida, third trimester: Secondary | ICD-10-CM | POA: Diagnosis not present

## 2019-10-14 LAB — COMPREHENSIVE METABOLIC PANEL
ALT: 58 U/L — ABNORMAL HIGH (ref 0–44)
AST: 33 U/L (ref 15–41)
Albumin: 2.8 g/dL — ABNORMAL LOW (ref 3.5–5.0)
Alkaline Phosphatase: 119 U/L (ref 38–126)
Anion gap: 11 (ref 5–15)
BUN: <5 mg/dL — ABNORMAL LOW (ref 6–20)
CO2: 19 mmol/L — ABNORMAL LOW (ref 22–32)
Calcium: 8.8 mg/dL — ABNORMAL LOW (ref 8.9–10.3)
Chloride: 108 mmol/L (ref 98–111)
Creatinine, Ser: 0.59 mg/dL (ref 0.44–1.00)
GFR calc Af Amer: >60 mL/min (ref >60)
GFR calc non Af Amer: >60 mL/min (ref >60)
Glucose, Bld: 85 mg/dL (ref 70–99)
Potassium: 3.7 mmol/L (ref 3.5–5.1)
Sodium: 138 mmol/L (ref 135–145)
Total Bilirubin: 0.3 mg/dL (ref 0.3–1.2)
Total Protein: 5.7 g/dL — ABNORMAL LOW (ref 6.5–8.1)

## 2019-10-14 LAB — CULTURE, OB URINE: Culture: 50000 — AB

## 2019-10-14 LAB — CBC
HCT: 40.1 % (ref 36.0–46.0)
Hemoglobin: 13.3 g/dL (ref 12.0–15.0)
MCH: 32.8 pg (ref 26.0–34.0)
MCHC: 33.2 g/dL (ref 30.0–36.0)
MCV: 99 fL (ref 80.0–100.0)
Platelets: 193 10*3/uL (ref 150–400)
RBC: 4.05 MIL/uL (ref 3.87–5.11)
RDW: 12.5 % (ref 11.5–15.5)
WBC: 8 10*3/uL (ref 4.0–10.5)
nRBC: 0 % (ref 0.0–0.2)

## 2019-10-14 LAB — GROUP B STREP BY PCR: Group B strep by PCR: POSITIVE — AB

## 2019-10-14 LAB — ABO/RH: ABO/RH(D): O POS

## 2019-10-14 LAB — TYPE AND SCREEN
ABO/RH(D): O POS
Antibody Screen: NEGATIVE

## 2019-10-14 LAB — SARS CORONAVIRUS 2 BY RT PCR (HOSPITAL ORDER, PERFORMED IN ~~LOC~~ HOSPITAL LAB): SARS Coronavirus 2: NEGATIVE

## 2019-10-14 LAB — POCT FERN TEST: POCT Fern Test: POSITIVE

## 2019-10-14 MED ORDER — OXYCODONE-ACETAMINOPHEN 5-325 MG PO TABS: 2.0000 | ORAL_TABLET | ORAL | Status: DC | PRN

## 2019-10-14 MED ORDER — ACETAMINOPHEN 325 MG PO TABS
650.0000 mg | ORAL_TABLET | ORAL | Status: DC | PRN
  Administered 2019-10-15: 650 mg via ORAL
  Filled 2019-10-14: qty 2

## 2019-10-14 MED ORDER — OXYTOCIN 40 UNITS IN NORMAL SALINE INFUSION - SIMPLE MED
2.5000 [IU]/h | INTRAVENOUS | Status: DC
  Filled 2019-10-14: qty 1000

## 2019-10-14 MED ORDER — AMOXICILLIN 500 MG PO CAPS: 500.0000 mg | ORAL_CAPSULE | Freq: Three times a day (TID) | ORAL | Status: DC

## 2019-10-14 MED ORDER — OXYTOCIN BOLUS FROM INFUSION
500.0000 mL | Freq: Once | INTRAVENOUS | Status: AC
  Administered 2019-10-15: 500 mL via INTRAVENOUS

## 2019-10-14 MED ORDER — MAGNESIUM SULFATE 40 GM/1000ML IV SOLN
2.0000 g/h | INTRAVENOUS | Status: DC
  Filled 2019-10-14: qty 1000

## 2019-10-14 MED ORDER — SODIUM CHLORIDE 0.9 % IV SOLN
2.0000 g | Freq: Four times a day (QID) | INTRAVENOUS | Status: DC
  Administered 2019-10-14 – 2019-10-15 (×5): 2 g via INTRAVENOUS
  Filled 2019-10-14 (×8): qty 2000

## 2019-10-14 MED ORDER — LIDOCAINE HCL (PF) 1 % IJ SOLN
30.0000 mL | INTRAMUSCULAR | Status: AC | PRN
  Administered 2019-10-15: 30 mL via SUBCUTANEOUS
  Filled 2019-10-14: qty 30

## 2019-10-14 MED ORDER — OXYCODONE-ACETAMINOPHEN 5-325 MG PO TABS: 1.0000 | ORAL_TABLET | ORAL | Status: DC | PRN

## 2019-10-14 MED ORDER — SODIUM CHLORIDE 0.9 % IV SOLN
250.0000 mg | Freq: Four times a day (QID) | INTRAVENOUS | Status: DC
  Administered 2019-10-14 – 2019-10-15 (×5): 250 mg via INTRAVENOUS
  Filled 2019-10-14 (×13): qty 5

## 2019-10-14 MED ORDER — FENTANYL CITRATE (PF) 100 MCG/2ML IJ SOLN
50.0000 ug | INTRAMUSCULAR | Status: DC | PRN
  Administered 2019-10-15: 50 ug via INTRAVENOUS
  Filled 2019-10-14: qty 2

## 2019-10-14 MED ORDER — LACTATED RINGERS IV SOLN
500.0000 mL | INTRAVENOUS | Status: DC | PRN
  Administered 2019-10-15: 1000 mL via INTRAVENOUS

## 2019-10-14 MED ORDER — LACTATED RINGERS IV SOLN
INTRAVENOUS | Status: DC
  Administered 2019-10-14 – 2019-10-15 (×2): via INTRAVENOUS

## 2019-10-14 MED ORDER — ERYTHROMYCIN BASE 250 MG PO TABS: 250.0000 mg | ORAL_TABLET | Freq: Four times a day (QID) | ORAL | Status: DC

## 2019-10-14 MED ORDER — MAGNESIUM SULFATE BOLUS VIA INFUSION
6.0000 g | Freq: Once | INTRAVENOUS | Status: AC
  Administered 2019-10-14: 6 g via INTRAVENOUS
  Filled 2019-10-14: qty 1000

## 2019-10-14 MED ORDER — SOD CITRATE-CITRIC ACID 500-334 MG/5ML PO SOLN: 30.0000 mL | ORAL | Status: DC | PRN

## 2019-10-14 MED ORDER — ONDANSETRON HCL 4 MG/2ML IJ SOLN
4.0000 mg | Freq: Four times a day (QID) | INTRAMUSCULAR | Status: DC | PRN
  Administered 2019-10-15: 4 mg via INTRAVENOUS
  Filled 2019-10-14: qty 2

## 2019-10-14 NOTE — H&P (Signed)
Olivia Gordon is a 33 y.o. female G2P0101 at 32 weeks 4 days sent to Maternity admissions from [place] office for early active labor.  In office, Dr. Estanislado Pandy check the cervix of patient and it was 3cm / 100/0 station with a bulging bag.   In the MAU, patient ruptured her membranes spontaneously clear fluid.  Patient recently managed completed course of betamethasone for fetal lung maturity - completed 09/30/19.   Patient does have cramping, no vaginal bleeding, reports good fetal movement.   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 [facility] ID the patient doesn't need to be treated again unless her RPR titer increases to 1:8 or 1:16 due to her past history . Both her RPR and/or FTA-ABS  remain positive in spite of resolved infection.  Recent Titer 1:1 -H/o preterm labor at 28 weeks - on Makena therapy during this pregnancy since 16 weeks.  - AMA - NIPS low risk female - H/o Complicated UTI ECOLI with transient transaminitis after therapy - Bartholin Cyst   OB History    Gravida  2   Para  1   Term  0   Preterm  1   AB  0   Living  1     SAB  0   TAB  0   Ectopic  0   Multiple  0   Live Births  1          Past Medical History:  Diagnosis Date  . Pancreatitis    Past Surgical History:  Procedure Laterality Date  . CHOLECYSTECTOMY N/A 07/18/2017   Procedure: LAPAROSCOPIC CHOLECYSTECTOMY WITH INTRAOPERATIVE CHOLANGIOGRAM;  Surgeon: Darnell Level, MD;  Location: [facility];  Service: General;  Laterality: N/A;  . ERCP Left 07/19/2017   Procedure: ENDOSCOPIC RETROGRADE CHOLANGIOPANCREATOGRAPHY (ERCP);  Surgeon: Kerin Salen, MD;  Location: [facility];  Service: Gastroenterology;  Laterality: Left;   Family History: family history includes Healthy in her father and mother. Social History:  reports that she has never smoked. She has never used smokeless tobacco. She reports previous alcohol use.  She reports current drug use.     Maternal Diabetes: No Genetic Screening: Normal NIPT low risk female  Maternal Ultrasounds/Referrals: Normal Fetal Ultrasounds or other Referrals:  None Maternal Substance Abuse:  No Significant Maternal Medications:  Meds include: Other:  s/p Metronidazole Significant Maternal Lab Results:  Other:  RPR positive Other Comments:  None  Review of Systems  All other systems reviewed and are negative.  Maternal Medical History:  Reason for admission: Rupture of membranes and contractions.   Fetal activity: Perceived fetal activity is normal.   Last perceived fetal movement was within the past 12 hours.    Prenatal complications: Infection and preterm labor.   Prenatal Complications - Diabetes: none.      Blood pressure 126/72, pulse 92, temperature 98.2 F (36.8 C), temperature source Oral, resp. rate 17, height 5\' 5"  (1.651 m), weight 83.8 kg, last menstrual period 02/28/2019, SpO2 100 %.   Fetal Exam Fetal Monitor Review: Mode: hand-held doppler probe.   Baseline rate: 150.  Variability: moderate (6-25 bpm).   Pattern: accelerations present and no decelerations.    Fetal State Assessment: Category I - tracings are normal.     Physical Exam  Prenatal labs: ABO, Rh:  O positive Antibody:  negative Rubella:  Immune RPR:   POS HBsAg: Negative (07/05 1300)  HIV:   Neg GBS:  Unknown  Assessment/Plan: 33 year old G2P0101 at 32 weeks 4 days Premature Preterm rupture of membranes Admit to labor and delivery IV hydration Latency antibiotics  Magnesium sulfate for neuroprophylaxis NICU and MFM consultation placed.  Will ask MFM if rescue dose of corticosteroids necessary and if official growth [place] needed.  Will have CNM check vulva and perineum for HSV lesions Continuous monitoring Epidural on demand  Tishara Pizano 10/14/2019, 6:57 PM

## 2019-10-14 NOTE — MAU Note (Signed)
srom of clear fluid when walking from triage to rm.  Spec collected, +fern

## 2019-10-14 NOTE — Progress Notes (Signed)
Labor Progress Note  Olivia Gordon is a 33 y.o. female G2P0101 at 32 weeks 4 days sent to Maternity admissions from [place] office for early active labor.  In office, Dr. Cletis Media check the cervix of patient and it was 3cm / 100/0 station with a bulging bag.   In the MAU, patient ruptured her membranes spontaneously clear fluid.  Patient recently managed completed course of betamethasone for fetal lung maturity - completed 09/30/19.   Patient does have cramping, no vaginal bleeding, reports good fetal movement.   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 Frances[facility] ID the patient doesn't need to be treated again unless her RPR titer increases to 1:8 or 1:16 due to her past history . Both her RPR and/or FTA-ABS  remain positive in spite of resolved infection.  Recent Titer 1:1 -H/o preterm labor at 28 weeks - on Makena therapy during this pregnancy since 16 weeks.  - AMA - NIPS low risk female - H/o Complicated UTI ECOLI with transient transaminitis after therapy - Bartholin Cyst  Subjective: Pt in bed resting quietly with husband at bedside, reviewed preterm birth, magnesium and recuse dose if she goes into labor with her, pt denies any questions. Pt denies feeling cxt now, stated usually has cxt in the morning at 0200.  Patient Active Problem List   Diagnosis Date Noted  . Abnormal liver function tests 10/12/2019  . Preterm labor 10/12/2019  . Abscess of vulva 05/17/2019  . Inguinal hernia 05/17/2019  . 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 History of syphilis 04/23/2019  . Pregnant 04/21/2019  . History of premature delivery 04/17/2019  . Cholelithiasis with chronic cholecystitis 07/11/2017  . History of pancreatitis 07/11/2017   Objective: BP 117/67   Pulse 99   Temp 97.8 F (36.6 C) (Oral)   Resp 17   Ht 5\' 5"  (1.651 m)   Wt 83.5 kg   LMP 02/28/2019   SpO2 100%   BMI 30.62 kg/m  No  intake/output data recorded. No intake/output data recorded. NST: FHR baseline 150 bpm, Variability: moderate, Accelerations:present, Decelerations:  Absent= Cat 1/Reactive CTX:  Irritable uterus, occ cxt  Uterus gravid, soft non tender, moderate to palpate with contractions.  SVE:  Dilation: 3.5 Effacement (%): 100 Station: Ballotable Exam by:: Lamark Schue, CNM  SVE with speculum exam showed no active lesion on vulva or cervix Vaginal pooling noted Large forebag palpated Vertex verified by sutures.  Magnesium @ 2gm/hr  Assessment:  Olivia Gordon is a 33 y.o. female G2P0101 at 32 weeks 4 days sent to Maternity admissions from [place] office for early active labor.  In office on 12/2, Dr. Cletis Media check the cervix of patient and it was 3cm / 100/0 station with a bulging bag.   In the MAU, patient ruptured her membranes spontaneously clear fluid.  Patient recently managed completed course of betamethasone for fetal lung maturity - completed 09/30/19.   Patient denies cramping now, no vaginal bleeding, reports good fetal movement. Pt stable not laboring now.   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 [facility] ID the patient doesn't need to be treated again unless her RPR titer increases to 1:8 or 1:16 due to her past history . Both her RPR and/or FTA-ABS  remain positive in spite of resolved infection.  Recent Titer 1:1 -H/o preterm labor at 28 weeks -  on Makena therapy during this pregnancy since 16 weeks.  - AMA - NIPS low risk female - H/o Complicated UTI ECOLI with transient transaminitis after therapy - Bartholin Cyst Patient Active Problem List   Diagnosis Date Noted  . Abnormal liver function tests 10/12/2019  . Preterm labor 10/12/2019  . Abscess of vulva 05/17/2019  . Inguinal hernia 05/17/2019  . 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 History of syphilis 04/23/2019  . Pregnant 04/21/2019  . History of  premature delivery 04/17/2019  . Cholelithiasis with chronic cholecystitis 07/11/2017  . History of pancreatitis 07/11/2017   NICHD: Category 1  Membranes:  PPROM @ 1830 on 12/2  x 2hrs, no s/s of infection  Pain management:               IV pain management: x PRN             Epidural placement:  PRN  GBS Unknown  PPROM with PTL: Magnesium started at 2030 on 12/2, started on latency abx, ampicillin and  erythromycin.  Plan: Continue plan GBS: Unknown, PCR pending  PTL with PPROM: Magnesium for neuro-protectant for 12-24 hours, BMZ if in active labor, NICU consult pending, but made aware, MFM consult pending with [place] for EFW.  Ampicillin and erythromycin to continue for 48 hours, then switch to po amoxicillin and erythromycin for 5 days Continuous monitoring Rest Frequent position changes to facilitate fetal rotation and descent. Will reassess with cervical exam only if necessary in labor Anticipate labor progression and vaginal delivery.   Md Mora Appl gave me report and informed me of the above plan.    Dale Headrick, NP-C, CNM, MSN 10/14/2019. 8:28 PM

## 2019-10-14 NOTE — MAU Note (Signed)
Hasn't felt ctxs since Monday when she was here.  Was dilated at office today, sent in for further eval.  Denies bleeding or leaking.

## 2019-10-14 NOTE — Progress Notes (Signed)
[facility]  Prenatal Consult       10/14/2019  8:42 PM   I was asked by Dr. Alwyn Pea to consult on this patient for possible preterm delivery.  I had the pleasure of meeting with Ms. Vanengen today.  She is a 33 y/o G2P0101 at 65 and 4/[redacted] weeks gestation.  She was admitted for preterm labor and SROM.  She has been started on magnesium and IV hydration.  She had a recent course of BMZ on 11/17 and 11/18.  Her pregnancy is further complicated by history of premature delivery at 28 weeks.  She has history of HSV and is not on prophylaxis, but there are no lesions on exam.  She also has a history of syphilis, but after ID consultation her titers have decreased to 1:1 and neither she nor infant will require any treatment.    I explained that the neonatal intensive care team would be present for the delivery and outlined the likely delivery room course for this baby including routine resuscitation and NRP-guided approaches to the treatment of respiratory distress. We discussed other common problems associated with prematurity including respiratory distress syndrome/CLD, apnea, feeding issues, temperature regulation, and infection risk.  We briefly discussed IVH/PVL, ROP, and NEC and that these are complications associated with prematurity, but that by 30 weeks are uncommon.    We discussed the average length of stay but I noted that the actual LOS would depend on the severity of problems encountered and response to treatments.  We discussed visitation policies and the resources available while her child is in the hospital.  We discussed the importance of good nutrition and various methods of providing nutrition (parenteral hyperalimentation, gavage feedings and/or oral feeding). We discussed the benefits of human milk and she does intend on providing breast milk.    Thank you for involving [place] in the care of this patient. A member of our team will be available should the family have additional  questions.  Time for consultation approximately 45 minutes.   _____________________ Electronically Signed By: Clinton Gallant, MD Neonatologist

## 2019-10-15 ENCOUNTER — Encounter (HOSPITAL_COMMUNITY): Payer: Self-pay | Admitting: *Deleted

## 2019-10-15 DIAGNOSIS — Z3A32 32 weeks gestation of pregnancy: Secondary | ICD-10-CM

## 2019-10-15 DIAGNOSIS — O09523 Supervision of elderly multigravida, third trimester: Secondary | ICD-10-CM

## 2019-10-15 DIAGNOSIS — O42113 Preterm premature rupture of membranes, onset of labor more than 24 hours following rupture, third trimester: Secondary | ICD-10-CM

## 2019-10-15 DIAGNOSIS — O09293 Supervision of pregnancy with other poor reproductive or obstetric history, third trimester: Secondary | ICD-10-CM

## 2019-10-15 MED ORDER — BETAMETHASONE SOD PHOS & ACET 6 (3-3) MG/ML IJ SUSP
12.0000 mg | INTRAMUSCULAR | Status: DC
  Administered 2019-10-15: 12 mg via INTRAMUSCULAR
  Filled 2019-10-15: qty 5
  Filled 2019-10-15: qty 2

## 2019-10-15 MED ORDER — BUPIVACAINE HCL (PF) 0.25 % IJ SOLN
30.0000 mL | Freq: Once | INTRAMUSCULAR | Status: AC
  Administered 2019-10-15: 10 mL

## 2019-10-15 MED ORDER — IBUPROFEN 600 MG PO TABS
600.0000 mg | ORAL_TABLET | Freq: Four times a day (QID) | ORAL | Status: DC
  Administered 2019-10-15 – 2019-10-17 (×7): 600 mg via ORAL
  Filled 2019-10-15 (×7): qty 1

## 2019-10-15 MED ORDER — FENTANYL-BUPIVACAINE-NACL 0.5-0.125-0.9 MG/250ML-% EP SOLN
12.0000 mL/h | EPIDURAL | Status: DC | PRN
  Filled 2019-10-15: qty 250

## 2019-10-15 MED ORDER — EPHEDRINE 5 MG/ML INJ: 10.0000 mg | INTRAVENOUS | Status: DC | PRN

## 2019-10-15 MED ORDER — DIBUCAINE (PERIANAL) 1 % EX OINT: 1.0000 "application " | TOPICAL_OINTMENT | CUTANEOUS | Status: DC | PRN

## 2019-10-15 MED ORDER — COCONUT OIL OIL
1.0000 "application " | TOPICAL_OIL | Status: DC | PRN
  Administered 2019-10-16: 1 via TOPICAL

## 2019-10-15 MED ORDER — ACETAMINOPHEN 500 MG PO TABS
1000.0000 mg | ORAL_TABLET | Freq: Once | ORAL | Status: AC
  Administered 2019-10-15: 1000 mg via ORAL
  Filled 2019-10-15: qty 2

## 2019-10-15 MED ORDER — PRENATAL MULTIVITAMIN CH
1.0000 | ORAL_TABLET | Freq: Every day | ORAL | Status: DC
  Administered 2019-10-16: 1 via ORAL
  Filled 2019-10-15: qty 1

## 2019-10-15 MED ORDER — BUTALBITAL-APAP-CAFFEINE 50-325-40 MG PO TABS
2.0000 | ORAL_TABLET | Freq: Once | ORAL | Status: AC
  Administered 2019-10-15: 2 via ORAL
  Filled 2019-10-15: qty 2

## 2019-10-15 MED ORDER — ZOLPIDEM TARTRATE 5 MG PO TABS: 5.0000 mg | ORAL_TABLET | Freq: Every evening | ORAL | Status: DC | PRN

## 2019-10-15 MED ORDER — DIPHENHYDRAMINE HCL 25 MG PO CAPS: 25.0000 mg | ORAL_CAPSULE | Freq: Four times a day (QID) | ORAL | Status: DC | PRN

## 2019-10-15 MED ORDER — ACETAMINOPHEN 325 MG PO TABS: 650.0000 mg | ORAL_TABLET | ORAL | Status: DC | PRN

## 2019-10-15 MED ORDER — LACTATED RINGERS IV SOLN: 500.0000 mL | Freq: Once | INTRAVENOUS | Status: DC

## 2019-10-15 MED ORDER — PHENYLEPHRINE 40 MCG/ML (10ML) SYRINGE FOR IV PUSH (FOR BLOOD PRESSURE SUPPORT): 80.0000 ug | PREFILLED_SYRINGE | INTRAVENOUS | Status: DC | PRN

## 2019-10-15 MED ORDER — ONDANSETRON HCL 4 MG/2ML IJ SOLN: 4.0000 mg | INTRAMUSCULAR | Status: DC | PRN

## 2019-10-15 MED ORDER — WITCH HAZEL-GLYCERIN EX PADS: 1.0000 "application " | MEDICATED_PAD | CUTANEOUS | Status: DC | PRN

## 2019-10-15 MED ORDER — ONDANSETRON HCL 4 MG PO TABS: 4.0000 mg | ORAL_TABLET | ORAL | Status: DC | PRN

## 2019-10-15 MED ORDER — VALACYCLOVIR HCL 500 MG PO TABS
1000.0000 mg | ORAL_TABLET | Freq: Every day | ORAL | Status: DC
  Administered 2019-10-15: 1000 mg via ORAL
  Filled 2019-10-15: qty 2

## 2019-10-15 MED ORDER — BENZOCAINE-MENTHOL 20-0.5 % EX AERO
1.0000 "application " | INHALATION_SPRAY | CUTANEOUS | Status: DC | PRN
  Administered 2019-10-16: 1 via TOPICAL
  Filled 2019-10-15: qty 56

## 2019-10-15 MED ORDER — TETANUS-DIPHTH-ACELL PERTUSSIS 5-2.5-18.5 LF-MCG/0.5 IM SUSP: 0.5000 mL | Freq: Once | INTRAMUSCULAR | Status: AC

## 2019-10-15 MED ORDER — BUPIVACAINE HCL (PF) 0.25 % IJ SOLN
INTRAMUSCULAR | Status: AC
  Filled 2019-10-15: qty 10

## 2019-10-15 MED ORDER — SIMETHICONE 80 MG PO CHEW: 80.0000 mg | CHEWABLE_TABLET | ORAL | Status: DC | PRN

## 2019-10-15 MED ORDER — DIPHENHYDRAMINE HCL 50 MG/ML IJ SOLN: 12.5000 mg | INTRAMUSCULAR | Status: DC | PRN

## 2019-10-15 MED ORDER — SENNOSIDES-DOCUSATE SODIUM 8.6-50 MG PO TABS
2.0000 | ORAL_TABLET | ORAL | Status: DC
  Administered 2019-10-16 (×2): 2 via ORAL
  Filled 2019-10-15 (×2): qty 2

## 2019-10-15 NOTE — Progress Notes (Signed)
Hospital day # 1 pregnancy at [redacted]w[redacted]d; PTL and PPROM 10/14/19 at 18:35  S: well, reports good fetal activity      Contractions:none, irregular,not felt by patient      Vaginal bleeding:none now       Vaginal discharge: no significant change  O: BP 126/77   Pulse 91   Temp 98.5 F (36.9 C) (Oral)   Resp 20   Ht 5\' 5"  (1.651 m)   Wt 83.5 kg   LMP 02/28/2019   SpO2 100%   BMI 30.62 kg/m       Fetal tracings:reviewed and reassuring      Uterus gravid and non-tender      Extremities: no significant edema and no signs of DVT  A: [redacted]w[redacted]d with PPROM and GBS +     Stable     No evidence of active labor  P: Discussed with MFM who will see patient this afternoon      Repeat BMZ course      No need for neuroprotection >= 32 weeks; will D/C MgSO4      Continue antibiotic prophylaxis      Start HSV prophylaxis   Dede Query Journe Hallmark  MD 10/15/2019 11:14 AM

## 2019-10-15 NOTE — Progress Notes (Addendum)
Labor Progress Note  Olivia Gordon is a 33 y.o. female G2P0101 at 32 weeks 4 days sent to Maternity admissions from [place] office for early active labor.  In office, Dr. Estanislado Pandy check the cervix of patient and it was 3cm / 100/0 station with a bulging bag.   In the MAU, patient ruptured her membranes spontaneously clear fluid.  Patient recently managed completed course of betamethasone for fetal lung maturity - completed 09/30/19.   Patient does have cramping, no vaginal bleeding, reports good fetal movement.   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 [facility] ID the patient doesn't need to be treated again unless her RPR titer increases to 1:8 or 1:16 due to her past history . Both her RPR and/or FTA-ABS  remain positive in spite of resolved infection.  Recent Titer 1:1 -H/o preterm labor at 28 weeks - on Makena therapy during this pregnancy since 16 weeks.  - AMA - NIPS low risk female - H/o Complicated UTI ECOLI with transient transaminitis after therapy - Bartholin Cyst  Subjective: Pt in bed resting quietly with husband at bedside, pt stable and just c/o mild HA, pt endorses feeling it is coming from being hungry. Pt also having cxt, mild, at time unable to feel them if they are showing on the monitor, and other time feels them maybe once every 20-30 mins and has to breath through it.  Patient Active Problem List   Diagnosis Date Noted  . Abnormal liver function tests 10/12/2019  . Preterm labor 10/12/2019  . Abscess of vulva 05/17/2019  . Inguinal hernia 05/17/2019  . 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 History of syphilis 04/23/2019  . Pregnant 04/21/2019  . History of premature delivery 04/17/2019  . Cholelithiasis with chronic cholecystitis 07/11/2017  . History of pancreatitis 07/11/2017   Objective: BP 118/72   Pulse 98   Temp 98.2 F (36.8 C) (Oral)   Resp 16   Ht 5\' 5"  (1.651 m)   Wt  83.5 kg   LMP 02/28/2019   SpO2 100%   BMI 30.62 kg/m  No intake/output data recorded. Total I/O In: 2956.9 [P.O.:1260; I.V.:1296.9; IV Piggyback:400] Out: 2100 [Urine:2100] NST: FHR baseline 150 bpm, Variability: moderate, Accelerations:present, Decelerations:  Absent= Cat 1/Non-Reactive (Most likely due to magnesium)  CTX: 3-8 lasting 100 seconds  Uterus gravid, soft non tender, moderate to palpate with contractions.   Exam deferred last exam @ 2030 on 12/3 SVE:  Dilation: 3.5 Effacement (%): 100 Station: Ballotable Exam by:: Fani Rotondo, CNM  SVE with speculum exam showed no active lesion on vulva or cervix Vaginal pooling noted Large forebag palpated Vertex verified by sutures.  Magnesium @ 2gm/hr  Assessment:  Olivia Gordon is a 33 y.o. female G2P0101 at 32 weeks 4 days sent to Maternity admissions from [place] office for early active labor.  In office on 12/2, Dr. 14/2 check the cervix of patient and it was 3cm / 100/0 station with a bulging bag.    In the MAU, patient ruptured her membranes spontaneously clear fluid.  Patient recently managed completed course of betamethasone for fetal lung maturity - completed 09/30/19.   Patient denies cramping now, no vaginal bleeding, reports good fetal movement. Pt stable not laboring now, but mild cxt, HA hunger HA, BP stable, no s/sx of preeclampsia.   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 Frances[facility] ID the patient doesn't need to be treated again unless her RPR titer increases to 1:8 or 1:16 due to her past history . Both her RPR and/or FTA-ABS  remain positive in spite of resolved infection.  Recent Titer 1:1 -H/o preterm labor at 28 weeks - on Makena therapy during this pregnancy since 16 weeks.  - AMA - NIPS low risk female - H/o Complicated UTI ECOLI with transient transaminitis after therapy - Bartholin Cyst Patient Active Problem List    Diagnosis Date Noted  . Abnormal liver function tests 10/12/2019  . Preterm labor 10/12/2019  . Abscess of vulva 05/17/2019  . Inguinal hernia 05/17/2019  . 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 History of syphilis 04/23/2019  . Pregnant 04/21/2019  . History of premature delivery 04/17/2019  . Cholelithiasis with chronic cholecystitis 07/11/2017  . History of pancreatitis 07/11/2017   NICHD: Category 1  Membranes:  PPROM @ 3818 on 12/2  x 10hrs, no s/s of infection  Pain management:               IV pain management: x PRN             Epidural placement:  PRN  GBS Positive  Ampicillin x2 doses already  PPROM with PTL: Magnesium started at 2030 on 12/2, started on latency abx, ampicillin and  erythromycin.  Plan: Continue plan GBS: PCR Positive, being covered with ampicillin   HA: Fioricet  PTL with PPROM: Magnesium for neuro-protectant for 12-24 hours, BMZ if in active labor, NICU consult pending, but made aware, MFM consult pending with [place] for EFW.  Ampicillin and erythromycin to continue for 48 hours, then switch to po amoxicillin and erythromycin for 5 days Continuous monitoring Rest Frequent position changes to facilitate fetal rotation and descent. Will reassess with cervical exam only if necessary in labor Anticipate labor progression and vaginal delivery.   Md Alwyn Pea gave me report and informed me of the above plan.    Noralyn Pick, NP-C, CNM, MSN 10/15/2019. 6:18 AM

## 2019-10-15 NOTE — Progress Notes (Signed)
Labor Progress Note  Olivia Gordon is a 33 y.o. female G2P0101 at 32 weeks 4 days sent to Maternity admissions from [place] office for early active labor.  In office, Dr. Cletis Media check the cervix of patient and it was 3cm / 100/0 station with a bulging bag.   In the MAU, patient ruptured her membranes spontaneously clear fluid.  Patient recently managed completed course of betamethasone for fetal lung maturity - completed 09/30/19.   Patient does have cramping, no vaginal bleeding, reports good fetal movement.   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 [facility] ID the patient doesn't need to be treated again unless her RPR titer increases to 1:8 or 1:16 due to her past history . Both her RPR and/or FTA-ABS  remain positive in spite of resolved infection.  Recent Titer 1:1 -H/o preterm labor at 28 weeks - on Makena therapy during this pregnancy since 16 weeks.  - AMA - NIPS low risk female - H/o Complicated UTI ECOLI with transient transaminitis after therapy - Bartholin Cyst  Subjective: Pt in bed feeling more cxt, pace breathing through them, pt endorses feeling pressure in her rectum, and requesting pain meds.  Patient Active Problem List   Diagnosis Date Noted  . Abnormal liver function tests 10/12/2019  . Preterm labor 10/12/2019  . Abscess of vulva 05/17/2019  . Inguinal hernia 05/17/2019  . 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 History of syphilis 04/23/2019  . Pregnant 04/21/2019  . History of premature delivery 04/17/2019  . Cholelithiasis with chronic cholecystitis 07/11/2017  . History of pancreatitis 07/11/2017   Objective: BP 137/72   Pulse 93   Temp 100.1 F (37.8 C) (Axillary)   Resp 20   Ht 5\' 5"  (1.651 m)   Wt 83.5 kg   LMP 02/28/2019   SpO2 100%   BMI 30.62 kg/m  I/O last 3 completed shifts: In: 3151.7 [P.O.:1888; I.V.:2065.8; IV Piggyback:612.3] Out: 3625 [Urine:3625] No  intake/output data recorded. NST: FHR baseline 170 bpm, Variability: moderate, Accelerations:present, Decelerations:  Absent= Cat 2/Reactive  CTX: 3 lasting 100 seconds  Uterus gravid, soft non tender, moderate to palpate with contractions.   SVE exam change with fetal descent  SVE:  Dilation: 3.5 Effacement (%): 100 Station: -2 Exam by:: [facility], CNM  Vertex verified by sutures.    Assessment:  Olivia Gordon is a 33 y.o. female G2P0101 at 32 weeks 4 days sent to Maternity admissions from [place] office for early active labor on 12/2.  In office on 12/2, Dr. Cletis Media check the cervix of patient and it was 3cm / 100/0 station with a bulging bag.  NICU and MFM consults completed. Pt laboring now, requesting pain meds for cxt pain. Cervix unchanged but fetus is descending.   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 [facility] ID the patient doesn't need to be treated again unless her RPR titer increases to 1:8 or 1:16 due to her past history . Both her RPR and/or FTA-ABS  remain positive in spite of resolved infection.  Recent Titer 1:1 -H/o preterm labor at 28 weeks - on Makena therapy during this pregnancy since 16 weeks.  - AMA - NIPS low risk female - H/o Complicated UTI ECOLI with transient transaminitis after therapy - Bartholin Cyst Patient Active Problem List   Diagnosis Date Noted  .  Abnormal liver function tests 10/12/2019  . Preterm labor 10/12/2019  . Abscess of vulva 05/17/2019  . Inguinal hernia 05/17/2019  . 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 History of syphilis 04/23/2019  . Pregnant 04/21/2019  . History of premature delivery 04/17/2019  . Cholelithiasis with chronic cholecystitis 07/11/2017  . History of pancreatitis 07/11/2017   NICHD: Category 2  Membranes:  PPROM @ 1830 on 12/2  x 24hrs, s/s of infection with fever of 100.4, fetal  tachycardia at baseline 1702  Pain  management:               IV pain management: x1 @ 1945             Epidural placement:  PRN  GBS Positive  Ampicillin x5 doses already  PPROM with PTL: Magnesium off @ 1130 am on 12/3, started on latency abx, ampicillin and  Erythromycin. BMZ rescue dose given @ 1210 on 12/3  Bartholin Cyst: Ward cathter in place.   Plan: Continue plan GBS: PCR Positive, being covered with ampicillin   HA: Fioricet  Low grade fever: 1000mg  tylenol order per Dr .  PTL with PPROM: Continue Ampicillin and erythromycin to continue for 48 hours, then switch to po amoxicillin and erythromycin for 5 days Continuous monitoring Rest Frequent position changes to facilitate fetal rotation and descent. Will reassess with cervical exam only if necessary in labor Anticipate labor progression and vaginal delivery.   Md Dillard updated and recommended not given procardia due to pt laboring and potential infection.    Normand Sloop, NP-C, CNM, MSN 10/15/2019. 7:53 PM

## 2019-10-15 NOTE — Consult Note (Addendum)
MFM Consult  This patient was seen in consultation at the request of Dr Rivard due to [facility].  The patient is a 33 year old gravida 2 para 0101 who is currently at 30 weeks 5 days.  The patient was admitted yesterday following a routine exam in the office where her cervix was found to be 3 cm dilated and 100% effaced.  The patient then ruptured membranes while being evaluated in the MAU.  The patient already received a complete course of antenatal corticosteroids about 2 weeks ago due to preterm labor.  The patient reports a prior pregnancy that was complicated by a preterm delivery at around 28 weeks.  Due to this history, she has been treated with weekly progesterone injections in her current pregnancy to decrease her risk of another preterm birth.  The patient has been observed on labor and delivery overnight.  She was placed on magnesium sulfate for fetal neuro protection.  She has a history of HSV and syphilis.   The typical management of patients with PPROM was reviewed.  I advise close monitoring of temperature and pulse and other signs for intramniotic infection and advocate delivery for any evidence of this.    The patient was advised that due to PPROM, she will remain in the hospital until delivery, with daily fetal testing.  As she it has been greater than 2 weeks since she received her initial course of steroids and as she is at less than 34 weeks, a rescue course of antenatal corticosteroids is recommended.  The patient received the first dose of rescue steroids this afternoon.   As the patient is beyond 32 weeks, magnesium sulfate is no longer indicated for fetal neuro protection.  Magnesium sulfate was discontinued this afternoon.   She should continue latency antibiotics for 7 days.    Should she remain stable, delivery will be indicated at around 34 weeks. Delivery should occur at any time should she go into spontaneous labor, should she show any signs of an intrauterine infection, or  for non-reassuring fetal status.   She should probably have a growth ultrasound performed while she is in the hospital.   Thank you for referring this very nice patient for a maternal-fetal medicine consultation.   A total of 30 minutes was spent with the patient, at least 15 minutes (>50%) of which involved face-to-face counseling and coordination of care.   Recommendations:   She should receive a rescue course of steroids.    Inpatient management with daily fetal testing until delivery due to PPROM.   Continue latency antibiotics for 7 days.   Fetal growth scan next week should she remain undelivered.   Delivery at around 34 weeks or prior should she go into spontaneous labor, show any signs of an intrauterine infection, or at any time for nonreassuring fetal status.

## 2019-10-16 LAB — CBC
HCT: 32.5 % — ABNORMAL LOW (ref 36.0–46.0)
Hemoglobin: 11 g/dL — ABNORMAL LOW (ref 12.0–15.0)
MCH: 32.8 pg (ref 26.0–34.0)
MCHC: 33.8 g/dL (ref 30.0–36.0)
MCV: 97 fL (ref 80.0–100.0)
Platelets: 199 10*3/uL (ref 150–400)
RBC: 3.35 MIL/uL — ABNORMAL LOW (ref 3.87–5.11)
RDW: 12.3 % (ref 11.5–15.5)
WBC: 11.2 10*3/uL — ABNORMAL HIGH (ref 4.0–10.5)
nRBC: 0 % (ref 0.0–0.2)

## 2019-10-16 NOTE — Lactation Note (Signed)
This note was copied from a baby's chart. Lactation Consultation Note:  P2,Infant is 103 hours old in couple/care. Infant 32 weeks. Staff nurse sat up a DEBP for mother. She reports that mother has hand expressed several colostrum vials . She reports she is unsure if mother is pumping regular.   Mother has a sign on her door that say,"Please do not enter, check with nurse". Staff nurse reported to [facility] that mother has not slept and doesn't want any interruptions .   LC recommend for staff nurse to inform mother that [facility] came for a visit and to call when ready to have initial consult.   Patient Name: Girl Chardai Gangemi [id number] Date: 10/16/2019     Maternal Data    Feeding    LATCH Score                   Interventions    Lactation Tools Discussed/Used     Consult Status      Darla Lesches 10/16/2019, 2:20 PM

## 2019-10-16 NOTE — Progress Notes (Signed)
Patient requests no rounding due to maternal exhaustion.

## 2019-10-16 NOTE — Progress Notes (Signed)
Subjective: Postpartum Day # 1 : S/P NSVD due to PPROM and PTL. Patient up ad lib, denies syncope or dizziness. Reports consuming regular diet without issues and denies N/V. Patient reports 0 bowel movement + passing flatus.  Denies issues with urination and reports bleeding is "lighter."  Patient is pump feeding and reports going well.  Desires possible IUD for postpartum contraception.  Pain is being appropriately managed with use of po med.   no laceration Feeding:  pump Contraceptive plan:  Possible IUD  Objective: Vital signs in last 24 hours: Patient Vitals for the past 24 hrs:  BP Temp Temp src Pulse Resp SpO2  10/16/19 0500 115/80 98.1 F (36.7 C) Oral 79 17 100 %  10/16/19 0045 122/76 98.1 F (36.7 C) Oral 87 17 100 %  10/15/19 2339 131/73 98.6 F (37 C) Oral (!) 104 18 100 %  10/15/19 2301 132/70 - - 91 16 -  10/15/19 2231 116/78 - - (!) 124 16 -  10/15/19 2216 129/71 - - (!) 107 16 -  10/15/19 2202 127/63 - - 96 16 -  10/15/19 2146 120/67 - - 80 16 -  10/15/19 2131 134/63 - - 99 16 -  10/15/19 2117 124/67 98.4 F (36.9 C) Axillary (!) 108 16 -  10/15/19 2055 - - - - - 99 %  10/15/19 2050 - - - - - 100 %  10/15/19 2045 - - - - - 98 %  10/15/19 1944 (!) 120/54 - - 97 17 -  10/15/19 1915 - 100.1 F (37.8 C) Axillary - - -  10/15/19 1800 - - - - 20 -  10/15/19 1700 - - - - 20 -  10/15/19 1600 - - - - 18 -  10/15/19 1500 - - - - 20 -  10/15/19 1401 137/72 98.7 F (37.1 C) Oral 93 16 -  10/15/19 1301 129/75 - - 96 18 -  10/15/19 1201 133/74 - - 92 18 -  10/15/19 1101 126/77 98.5 F (36.9 C) Oral 91 20 -  10/15/19 1001 125/76 - - 88 20 -  10/15/19 0901 128/74 - - 90 18 -  10/15/19 0801 119/70 97.6 F (36.4 C) Oral 96 18 -     Physical Exam:  General: alert, cooperative, appears stated age and no distress Mood/Affect: Happy Lungs: clear to auscultation, no wheezes, rales or rhonchi, symmetric air entry.  Heart: normal rate, regular rhythm, normal S1, S2, no  murmurs, rubs, clicks or gallops. Breast: breasts appear normal, no suspicious masses, no skin or nipple changes or axillary nodes. Abdomen:  + bowel sounds, soft, non-tender GU: perineum intact, healing well. No signs of external hematomas.  Uterine Fundus: firm Lochia: appropriate Skin: Warm, Dry. DVT Evaluation: No evidence of DVT seen on physical exam. Negative Homan's sign. No cords or calf tenderness. No significant calf/ankle edema.  CBC Latest Ref Rng & Units 10/16/2019 10/14/2019 10/12/2019  WBC 4.0 - 10.5 K/uL 11.2(H) 8.0 7.5  Hemoglobin 12.0 - 15.0 g/dL 11.0(L) 13.3 13.2  Hematocrit 36.0 - 46.0 % 32.5(L) 40.1 38.6  Platelets 150 - 400 K/uL 199 193 197    Results for orders placed or performed during the hospital encounter of 10/14/19 (from the past 24 hour(s))  CBC     Status: Abnormal   Collection Time: 10/16/19  6:22 AM  Result Value Ref Range   WBC 11.2 (H) 4.0 - 10.5 K/uL   RBC 3.35 (L) 3.87 - 5.11 MIL/uL   Hemoglobin 11.0 (L) 12.0 -  15.0 g/dL   HCT 16.1 (L) 09.6 - 04.5 %   MCV 97.0 80.0 - 100.0 fL   MCH 32.8 26.0 - 34.0 pg   MCHC 33.8 30.0 - 36.0 g/dL   RDW 40.9 81.1 - 91.4 %   Platelets 199 150 - 400 K/uL   nRBC 0.0 0.0 - 0.2 %     CBG (last 3)  No results for input(s): GLUCAP in the last 72 hours.   I/O last 3 completed shifts: In: 4566.1 [P.O.:1888; I.V.:2065.8; IV Piggyback:612.3] Out: 4144 [Urine:3975; Blood:169]   Assessment Postpartum Day # 1 : S/P NSVD due to PPROM and PTL. Pt stable. -1 involution. pumpfeeding. Hemodynamically stable.   Plan: Continue other mgmt as ordered VTE prophylactics: Early ambulated as tolerates.  Pain control: Motrin/Tylenol PRN Education given regarding options for contraception, including barrier methods, injectable contraception, IUD placement, oral contraceptives.  Plan for discharge tomorrow, Breastfeeding and Lactation consult   Dr. Su Hilt to be updated on patient status  [facility] NP-C, CNM 10/16/2019,  7:19 AM

## 2019-10-16 NOTE — Progress Notes (Signed)
Patient screened out for psychosocial assessment since none of the following apply: °Psychosocial stressors documented in mother or baby's chart °Gestation less than 32 weeks °Code at delivery  °Infant with anomalies °Please contact the Clinical Social Worker if specific needs arise, by MOB's request, or if MOB scores greater than 9/yes to question 10 on Edinburgh Postpartum Depression Screen. ° °Kendle Turbin Boyd-Gilyard, MSW, LCSW °Clinical Social Work °[phone] °  °

## 2019-10-17 ENCOUNTER — Ambulatory Visit: Payer: Self-pay

## 2019-10-17 MED ORDER — IBUPROFEN 600 MG PO TABS: 600.0000 mg | ORAL_TABLET | Freq: Four times a day (QID) | ORAL | 0 refills | Status: DC

## 2019-10-17 NOTE — Discharge Summary (Addendum)
OB Discharge Summary     Patient Name: Olivia Gordon DOB: 1986-03-23 MRN: [id number]  Date of admission: 10/14/2019 Delivering MD: Noralyn Pick   Date of discharge: 10/17/2019  Admitting diagnosis: Pre-term labor Intrauterine pregnancy: [redacted]w[redacted]d     Secondary diagnosis:  Active Problems:   Preterm labor   SVD (spontaneous vaginal delivery)  Additional problems:  Patient Active Problem List   Diagnosis Date Noted  . SVD (spontaneous vaginal delivery) 10/17/2019  . Abnormal liver function tests 10/12/2019  . Preterm labor 10/12/2019  . Abscess of vulva 05/17/2019  . Inguinal hernia 05/17/2019  . History of syphilis 04/23/2019  . Pregnant 04/21/2019  . History of premature delivery 04/17/2019  . Cholelithiasis with chronic cholecystitis 07/11/2017  . History of pancreatitis 07/11/2017       Discharge diagnosis: Preterm Pregnancy Delivered                                                                                                Post partum procedures:None  Augmentation: None  Complications: None  Hospital course:  Onset of Labor With Vaginal Delivery     33 y.o. yo G2P0202 at [redacted]w[redacted]d was admitted in Active Labor on 10/14/2019. Patient had an uncomplicated labor course as follows:  Membrane Rupture Time/Date: 6:35 PM ,10/14/2019   Intrapartum Procedures: Episiotomy: None [1]                                         Lacerations:  None [1]  Patient had a delivery of a Viable infant. 10/15/2019  Information for the patient's newborn:  Laquesha, Holcomb [[id number]]  Delivery Method: Vaginal, Spontaneous(Filed from Delivery Summary)     Pateint had an uncomplicated postpartum course.  She is ambulating, tolerating a regular diet, passing flatus, and urinating well. Patient is discharged home in stable condition on 10/17/19.   Physical exam  Vitals:   10/16/19 0900 10/16/19 1300 10/16/19 2104 10/17/19 0607  BP: 121/75 120/78 130/79 124/76  Pulse: 78 85 74 75  Resp:  18 16 17 18   Temp: 98.2 F (36.8 C) 97.8 F (36.6 C) 98.4 F (36.9 C) 98.2 F (36.8 C)  TempSrc: Oral Oral Oral Oral  SpO2: 100% 100% 100%   Weight:      Height:       General: alert, cooperative and no distress Lochia: appropriate Uterine Fundus: firm Perineum: Intact, non-edematous DVT Evaluation: No evidence of DVT seen on physical exam. No cords or calf tenderness. No significant calf/ankle edema. Labs: Lab Results  Component Value Date   WBC 11.2 (H) 10/16/2019   HGB 11.0 (L) 10/16/2019   HCT 32.5 (L) 10/16/2019   MCV 97.0 10/16/2019   PLT 199 10/16/2019   CMP Latest Ref Rng & Units 10/14/2019  Glucose 70 - 99 mg/dL 85  BUN 6 - 20 mg/dL <5(L)  Creatinine 0.44 - 1.00 mg/dL 0.59  Sodium 135 - 145 mmol/L 138  Potassium 3.5 - 5.1 mmol/L 3.7  Chloride 98 - 111 mmol/L 108  CO2 22 - 32 mmol/L 19(L)  Calcium 8.9 - 10.3 mg/dL 3.5(T)  Total Protein 6.5 - 8.1 g/dL 7.3(U)  Total Bilirubin 0.3 - 1.2 mg/dL 0.3  Alkaline Phos 38 - 126 U/L 119  AST 15 - 41 U/L 33  ALT 0 - 44 U/L 58(H)    Discharge instruction: per After Visit Summary and "Baby and Me Booklet".  After visit meds:  Allergies as of 10/17/2019   No Known Allergies     Medication List    STOP taking these medications   NIFEdipine 10 MG capsule Commonly known as: PROCARDIA   nitrofurantoin (macrocrystal-monohydrate) 100 MG capsule Commonly known as: Macrobid   promethazine 25 MG tablet Commonly known as: PHENERGAN     TAKE these medications   ibuprofen 600 MG tablet Commonly known as: ADVIL Take 1 tablet (600 mg total) by mouth every 6 (six) hours.   multivitamin-prenatal 27-0.8 MG Tabs tablet Take 1 tablet by mouth daily at 12 noon.       Diet: routine diet  Activity: Advance as tolerated. Pelvic rest for 6 weeks.   Outpatient follow up: Call office on Monday, 10/19/19 to sched 1 wk appt to remove Ward catheter, then routine postpartum visit in 6 weeks  Follow up Appt:No future  appointments. Follow up Visit:No follow-ups on file.  Postpartum contraception: Considering IUD  Newborn Data: Live born female  Birth Weight: 4 lb 13.3 oz (2190 g) APGAR: 7, 8  Newborn Delivery   Birth date/time: 10/15/2019 21:00:00 Delivery type: Vaginal, Spontaneous      Baby Feeding: Breast and pumping Disposition:NICU   10/17/2019 Roma Schanz, CNM

## 2019-10-17 NOTE — Lactation Note (Signed)
This note was copied from a baby's chart. Lactation Consultation Note  Patient Name: Olivia Gordon [id number] Date: 10/17/2019 Reason for consult: Initial assessment;NICU baby;Preterm <34wks;Infant < 6lbs  Visited with P2 Mom of [redacted]w[redacted]d infant in the NICU.  Baby 34 hrs old.  Mom had been sleeping when [place] attempted to see Mom previously.   Mom experienced with pumping and bottle feeding her 1st child who is now 31, as he was born at 68 weeks.  Mom reports having a good milk supply.    Encouraged having baby STS as much as able to.  Encouraged pumping 8-10 times per 24 hrs.  Mom aware of initiation setting on the pump.  Mom has a personal DEBP at home.  Shared with Mom importance of using the Medela Symphony as much as possible as a hospital grade pump is the best to establish her milk supply.    Encouraged breast massage and hand expression, Mom states she was shown how to hand express.    Engorgement prevention and treatment reviewed.  Reviewed importance of disassembling pump parts, washing, rinsing and air drying in separate bin away from sink.  NICU book given to Mom.  Lactation brochure give to Mom.  Mom aware of IP and OP Lactation support available to her.    Maternal Data Formula Feeding for Exclusion: Yes Reason for exclusion: Admission to Intensive Care Unit (ICU) post-partum Has patient been taught Hand Expression?: Yes Does the patient have breastfeeding experience prior to this delivery?: Yes  Feeding Feeding Type: Donor Breast Milk  LATCH Score                   Interventions Interventions: Breast feeding basics reviewed;Skin to skin;Breast massage;Hand express;DEBP  Lactation Tools Discussed/Used Tools: Pump Breast pump type: Double-Electric Breast Pump WIC Program: No Pump Review: Setup, frequency, and cleaning;Milk Storage Initiated by:: RN Date initiated:: 10/16/19   Consult Status Consult Status: Follow-up Follow-up type:  In-patient    Broadus John 10/17/2019, 12:40 PM

## 2019-10-17 NOTE — Progress Notes (Signed)
Pt given updated d/c info re: f/u care/appt for Ward catheter.  Pt able to teach back perineal care from conversation with Dr Cletis Media with catheter was placed.

## 2019-10-19 ENCOUNTER — Ambulatory Visit: Payer: Self-pay

## 2019-10-19 LAB — SURGICAL PATHOLOGY

## 2019-10-19 NOTE — Lactation Note (Signed)
This note was copied from a baby's chart. Lactation Consultation Note  Patient Name: Girl Dorothymae Maciver [id number] Date: 10/19/2019 Reason for consult: Follow-up assessment   LC Follow Up Visit:  On my afternoon rounds I noticed mother was at baby's bedside.  Stopped in to see if she had any questions/concerns related to pumping.    Mother has been pumping with her DEBP at home but informed me that she always gets more milk when she pumps with the hospital grade DEBP.  She would like to obtain a new DEBP.  Her first child is now 65 years old.  She had questions regarding obtaining a pump from the hospital gift shop.  Explained the rental procedure for mother.  Inquired about alternate ways to obtain a DEBP.  Mother does not participate in the [facility] program but she does have private insurance.  Explained how she can call the insurance company to determine pump eligibility.  Mother was excited to hear about this and will follow through.  She will call for any other concerns.     Consult Status Follow-up type: In-patient    Little Ishikawa 10/19/2019, 3:49 PM

## 2019-10-21 ENCOUNTER — Ambulatory Visit: Payer: Self-pay

## 2019-10-21 NOTE — Lactation Note (Signed)
This note was copied from a baby's chart. Lactation Consultation Note  Patient Name: Olivia Gordon [id number]'T Date: 10/21/2019   Baby 33 days old and mother rocking baby.  Mother decided not to call insurance company regarding another pump since the one at home is pumping the equivalent to the hospital grade pump. Mother states she was happy to pump approx 8 oz this morning. Mother denies needing assistance further and will call if needed since she wants to spend quiet time with her infant. Praised mother for her efforts.       Maternal Data    Feeding Feeding Type: Breast Milk  LATCH Score                   Interventions    Lactation Tools Discussed/Used     Consult Status      Carlye Grippe 10/21/2019, 2:22 PM

## 2019-10-26 ENCOUNTER — Ambulatory Visit: Payer: Self-pay

## 2019-10-26 NOTE — Lactation Note (Signed)
This note was copied from a baby's chart. Lactation Consultation Note  Patient Name: Girl Dorismar Chay WERXV'Q Date: 10/26/2019   Spoke to NICU RN [place], mom is not in the room right now but she usually comes daily around 1-3 pm. She' pumping, baby is getting mostly mother's milk. Mom is also putting baby to breast for some "lick and learn" and doing STS. Baby started bottle feeding today, let RN [place] know to page lactation if mom has any questions or concerns regarding BF/pumping.  Maternal Data    Feeding Feeding Type: Breast Milk Nipple Type: Dr. Myra Gianotti [facility]  [facility] Score                   Interventions    Lactation Tools Discussed/Used     Consult Status      Gehrig Patras Francene Boyers 10/26/2019, 12:14 PM

## 2019-10-31 ENCOUNTER — Ambulatory Visit: Payer: Self-pay

## 2019-10-31 NOTE — Lactation Note (Signed)
This note was copied from a baby's chart. Lactation Consultation Note  Patient Name: Olivia Gordon FUXNA'T Date: 10/31/2019 Reason for consult: Follow-up assessment;Preterm <34wks;NICU baby  Visited with mom of a 6 week old NICU pre-term female; baby is on breastmilk given through bottles and gavage feeding. Mom told LC that NICU staff told her yesterday to stop bringing her breastmilk to the hospital because they don't have more room for it in the freezer.  Mom is getting about 12 oz. combined in the morning per pumping session and it slowly dwindles down to 4-5 oz during the afternoon/evening hours. Her husband is planning on getting a deep freezer for her breastmilk because she's going to continue pumping and will need room to save it, there's no more room in her freezer at home.  LC gave mom some tips on how to get a deep freezer since they don't have them in stock at [employer]. Mom reports that pumping sessions are comfortable and she feels like she's fully emptying the breast, she has a DEBP at home and pumping consistently every 3-4 hours; praised her for her efforts and encouraged her to keep up with the good work.  When [facility] asked about putting baby to breast or doing STS, mom voiced that she's been doing STS but she hasn't been putting baby to breast for "lick and learn" because they're concentrating more in getting calories for her than anything else. Asked mom to page lactation for assistance when she's ready to start putting baby to breast. Mom reported all questions and concerns were answered, she's aware of [facility] OP services and will call PRN.  Maternal Data    Feeding Feeding Type: Breast Milk Nipple Type: Dr. Myra Gianotti [facility]  LATCH Score                   Interventions Interventions: Breast feeding basics reviewed;DEBP  Lactation Tools Discussed/Used Tools: Pump Breast pump type: Double-Electric Breast Pump   Consult Status Consult Status:  PRN Follow-up type: In-patient    Willistine Ferrall Francene Boyers 10/31/2019, 3:37 PM

## 2019-11-07 ENCOUNTER — Ambulatory Visit: Payer: Self-pay

## 2019-11-07 NOTE — Lactation Note (Signed)
This note was copied from a baby's chart. Lactation Consultation Note  Patient Name: Olivia Gordon [id number] Date: 11/07/2019 Reason for consult: Follow-up assessment;NICU baby   Mom holding infant and bottle feeding.  Mom is pumping and denies any concerns regarding pumping.  She will pump 12 oz. Of breastmilk in the morning; pumping at 1130  pm at night then at 8 am in the morning.  She then pumps approx. 4-6 oz. During her daytime pumps.   LC reminded mom of resources for lactation; phone line for any questions and the option of  [facility] appointments if she desired to put infant to breast and desired assistance.    Infant going home tomorrow so mom mentions being awake in the middle of the night for pumpings/feedings and feels she will not be so full in the morning.  LC suggests trying to pump at least 8 times in 24 but to be mindful of full milk ducts and clogged ducts after extended periods of not pumping.      Maternal Data    Feeding Feeding Type: Breast Milk Nipple Type: Dr. Myra Gianotti [facility]  LATCH Score                   Interventions    Lactation Tools Discussed/Used Tools: Pump Breast pump type: Double-Electric Breast Pump   Consult Status Consult Status: PRN Date: 11/08/19 Follow-up type: In-patient    [name] [facility] 11/07/2019, 12:27 PM

## 2020-05-12 DIAGNOSIS — Z419 Encounter for procedure for purposes other than remedying health state, unspecified: Secondary | ICD-10-CM | POA: Diagnosis not present

## 2020-06-12 DIAGNOSIS — Z419 Encounter for procedure for purposes other than remedying health state, unspecified: Secondary | ICD-10-CM | POA: Diagnosis not present

## 2020-07-13 DIAGNOSIS — Z419 Encounter for procedure for purposes other than remedying health state, unspecified: Secondary | ICD-10-CM | POA: Diagnosis not present

## 2020-08-12 DIAGNOSIS — Z419 Encounter for procedure for purposes other than remedying health state, unspecified: Secondary | ICD-10-CM | POA: Diagnosis not present

## 2020-09-06 DIAGNOSIS — Z20822 Contact with and (suspected) exposure to covid-19: Secondary | ICD-10-CM | POA: Diagnosis not present

## 2020-09-12 DIAGNOSIS — Z419 Encounter for procedure for purposes other than remedying health state, unspecified: Secondary | ICD-10-CM | POA: Diagnosis not present

## 2020-10-12 DIAGNOSIS — Z419 Encounter for procedure for purposes other than remedying health state, unspecified: Secondary | ICD-10-CM | POA: Diagnosis not present

## 2020-11-12 DIAGNOSIS — Z419 Encounter for procedure for purposes other than remedying health state, unspecified: Secondary | ICD-10-CM | POA: Diagnosis not present

## 2020-12-13 DIAGNOSIS — Z419 Encounter for procedure for purposes other than remedying health state, unspecified: Secondary | ICD-10-CM | POA: Diagnosis not present

## 2020-12-16 DIAGNOSIS — Z1159 Encounter for screening for other viral diseases: Secondary | ICD-10-CM | POA: Diagnosis not present

## 2021-01-10 DIAGNOSIS — Z419 Encounter for procedure for purposes other than remedying health state, unspecified: Secondary | ICD-10-CM | POA: Diagnosis not present

## 2021-02-10 DIAGNOSIS — Z419 Encounter for procedure for purposes other than remedying health state, unspecified: Secondary | ICD-10-CM | POA: Diagnosis not present

## 2021-03-12 DIAGNOSIS — Z419 Encounter for procedure for purposes other than remedying health state, unspecified: Secondary | ICD-10-CM | POA: Diagnosis not present

## 2021-04-12 DIAGNOSIS — Z419 Encounter for procedure for purposes other than remedying health state, unspecified: Secondary | ICD-10-CM | POA: Diagnosis not present

## 2021-05-12 DIAGNOSIS — Z419 Encounter for procedure for purposes other than remedying health state, unspecified: Secondary | ICD-10-CM | POA: Diagnosis not present

## 2021-06-12 DIAGNOSIS — Z419 Encounter for procedure for purposes other than remedying health state, unspecified: Secondary | ICD-10-CM | POA: Diagnosis not present

## 2021-07-11 LAB — OB RESULTS CONSOLE GC/CHLAMYDIA
Chlamydia: NEGATIVE
Gonorrhea: NEGATIVE

## 2021-07-11 LAB — OB RESULTS CONSOLE RPR: RPR: REACTIVE

## 2021-07-11 LAB — OB RESULTS CONSOLE HEPATITIS B SURFACE ANTIGEN: Hepatitis B Surface Ag: NEGATIVE

## 2021-07-11 LAB — OB RESULTS CONSOLE ABO/RH: RH Type: POSITIVE

## 2021-07-11 LAB — OB RESULTS CONSOLE ANTIBODY SCREEN: Antibody Screen: NEGATIVE

## 2021-07-11 LAB — OB RESULTS CONSOLE RUBELLA ANTIBODY, IGM: Rubella: IMMUNE

## 2021-07-11 LAB — HEPATITIS C ANTIBODY: HCV Ab: NEGATIVE

## 2021-07-11 LAB — OB RESULTS CONSOLE HIV ANTIBODY (ROUTINE TESTING): HIV: NONREACTIVE

## 2021-07-13 DIAGNOSIS — Z419 Encounter for procedure for purposes other than remedying health state, unspecified: Secondary | ICD-10-CM | POA: Diagnosis not present

## 2021-08-12 DIAGNOSIS — Z419 Encounter for procedure for purposes other than remedying health state, unspecified: Secondary | ICD-10-CM | POA: Diagnosis not present

## 2021-09-05 ENCOUNTER — Other Ambulatory Visit: Payer: Self-pay | Admitting: Obstetrics and Gynecology

## 2021-09-05 DIAGNOSIS — Z363 Encounter for antenatal screening for malformations: Secondary | ICD-10-CM

## 2021-09-12 DIAGNOSIS — Z419 Encounter for procedure for purposes other than remedying health state, unspecified: Secondary | ICD-10-CM | POA: Diagnosis not present

## 2021-10-02 ENCOUNTER — Ambulatory Visit: Payer: 59 | Attending: Obstetrics and Gynecology

## 2021-10-02 ENCOUNTER — Ambulatory Visit: Payer: 59 | Admitting: *Deleted

## 2021-10-02 ENCOUNTER — Other Ambulatory Visit: Payer: Self-pay

## 2021-10-02 VITALS — BP 141/77 | HR 100

## 2021-10-02 DIAGNOSIS — O09522 Supervision of elderly multigravida, second trimester: Secondary | ICD-10-CM

## 2021-10-02 DIAGNOSIS — Z363 Encounter for antenatal screening for malformations: Secondary | ICD-10-CM | POA: Insufficient documentation

## 2021-10-02 DIAGNOSIS — Z3689 Encounter for other specified antenatal screening: Secondary | ICD-10-CM | POA: Diagnosis not present

## 2021-10-02 DIAGNOSIS — Z3A24 24 weeks gestation of pregnancy: Secondary | ICD-10-CM

## 2021-10-02 DIAGNOSIS — O09212 Supervision of pregnancy with history of pre-term labor, second trimester: Secondary | ICD-10-CM

## 2021-10-04 ENCOUNTER — Other Ambulatory Visit: Payer: Self-pay

## 2021-10-12 DIAGNOSIS — Z419 Encounter for procedure for purposes other than remedying health state, unspecified: Secondary | ICD-10-CM | POA: Diagnosis not present

## 2021-11-12 DIAGNOSIS — Z419 Encounter for procedure for purposes other than remedying health state, unspecified: Secondary | ICD-10-CM | POA: Diagnosis not present

## 2021-11-16 DIAGNOSIS — O330 Maternal care for disproportion due to deformity of maternal pelvic bones: Secondary | ICD-10-CM | POA: Diagnosis not present

## 2021-11-23 DIAGNOSIS — Z8751 Personal history of pre-term labor: Secondary | ICD-10-CM | POA: Diagnosis not present

## 2021-11-28 ENCOUNTER — Encounter (HOSPITAL_COMMUNITY): Payer: Self-pay | Admitting: Obstetrics & Gynecology

## 2021-11-28 ENCOUNTER — Other Ambulatory Visit: Payer: Self-pay

## 2021-11-28 ENCOUNTER — Inpatient Hospital Stay (HOSPITAL_COMMUNITY)
Admission: AD | Admit: 2021-11-28 | Discharge: 2021-11-28 | Disposition: A | Discharge status: Home / Self Care | Payer: Medicaid Other | Attending: Obstetrics & Gynecology | Admitting: Obstetrics & Gynecology

## 2021-11-28 DIAGNOSIS — O09523 Supervision of elderly multigravida, third trimester: Secondary | ICD-10-CM | POA: Diagnosis not present

## 2021-11-28 DIAGNOSIS — Z3689 Encounter for other specified antenatal screening: Secondary | ICD-10-CM | POA: Insufficient documentation

## 2021-11-28 DIAGNOSIS — Z3A32 32 weeks gestation of pregnancy: Secondary | ICD-10-CM | POA: Insufficient documentation

## 2021-11-28 DIAGNOSIS — O4703 False labor before 37 completed weeks of gestation, third trimester: Secondary | ICD-10-CM | POA: Insufficient documentation

## 2021-11-28 LAB — URINALYSIS, ROUTINE W REFLEX MICROSCOPIC
Bilirubin Urine: NEGATIVE
Glucose, UA: NEGATIVE mg/dL
Hgb urine dipstick: NEGATIVE
Ketones, ur: 40 mg/dL — AB
Leukocytes,Ua: NEGATIVE
Nitrite: NEGATIVE
Protein, ur: NEGATIVE mg/dL
Specific Gravity, Urine: 1.01 (ref 1.005–1.030)
pH: 6.5 (ref 5.0–8.0)

## 2021-11-28 LAB — WET PREP, GENITAL
Clue Cells Wet Prep HPF POC: NONE SEEN
Sperm: NONE SEEN
Trich, Wet Prep: NONE SEEN
WBC, Wet Prep HPF POC: <10 (ref <10)
Yeast Wet Prep HPF POC: NONE SEEN

## 2021-11-28 LAB — FETAL FIBRONECTIN: Fetal Fibronectin: POSITIVE — AB

## 2021-11-28 MED ORDER — BETAMETHASONE SOD PHOS & ACET 6 (3-3) MG/ML IJ SUSP
12.0000 mg | INTRAMUSCULAR | Status: DC
  Administered 2021-11-28: 12 mg via INTRAMUSCULAR
  Filled 2021-11-28: qty 5

## 2021-11-28 NOTE — MAU Provider Note (Signed)
History     CSN: [id number]  Arrival date and time: 11/28/21 0755   Event Date/Time   First Provider Initiated Contact with Patient 11/28/21 0827      Chief Complaint  Patient presents with   Contractions   36 y.o. G3P0020 @32 .1 wks presenting with ctx., Reports onset last night. Frequency is q20 min. Denies VB or LOF. Had sex 2 days ago. Reports good FM. Hx of PTB x2, is getting Makena.    OB History     Gravida  3   Para  2   Term  0   Preterm  2   AB  0   Living  2      SAB  0   IAB  0   Ectopic  0   Multiple  0   Live Births  2           Past Medical History:  Diagnosis Date   Pancreatitis    Syphilis     Past Surgical History:  Procedure Laterality Date   CHOLECYSTECTOMY N/A 07/18/2017   Procedure: LAPAROSCOPIC CHOLECYSTECTOMY WITH INTRAOPERATIVE CHOLANGIOGRAM;  Surgeon: 09/17/2017, MD;  Location: [facility];  Service: General;  Laterality: N/A;   ERCP Left 07/19/2017   Procedure: ENDOSCOPIC RETROGRADE CHOLANGIOPANCREATOGRAPHY (ERCP);  Surgeon: 09/18/2017, MD;  Location: [facility];  Service: Gastroenterology;  Laterality: Left;    Family History  Problem Relation Age of Onset   Healthy Mother    Healthy Father     Social History   Tobacco Use   Smoking status: Never   Smokeless tobacco: Never  Vaping Use   Vaping Use: Never used  Substance Use Topics   Alcohol use: Not Currently   Drug use: Yes    Allergies: No Known Allergies  No medications prior to admission.    Review of Systems  Gastrointestinal:  Negative for abdominal pain.  Genitourinary:  Negative for vaginal bleeding and vaginal discharge.  Physical Exam   Blood pressure 108/74, pulse 99, temperature 99 F (37.2 C), temperature source Oral, resp. rate 17, last menstrual period 04/17/2021, SpO2 99 %, unknown if currently breastfeeding.  Physical Exam Vitals and nursing note reviewed. Exam conducted with a chaperone present.  Constitutional:      General: She  is not in acute distress.    Appearance: Normal appearance.  HENT:     Head: Normocephalic and atraumatic.  Cardiovascular:     Rate and Rhythm: Normal rate.  Pulmonary:     Effort: Pulmonary effort is normal. No respiratory distress.  Abdominal:     Palpations: Abdomen is soft.     Tenderness: There is no abdominal tenderness.  Genitourinary:    Comments: VE: closed/thick Musculoskeletal:        General: Normal range of motion.     Cervical back: Normal range of motion.  Skin:    General: Skin is warm and dry.  Neurological:     General: No focal deficit present.     Mental Status: She is alert and oriented to person, place, and time.  Psychiatric:        Mood and Affect: Mood normal.        Behavior: Behavior normal.  EFM: 135 bpm, mod variability, + accels, no decels Toco: irreg w/UI  Results for orders placed or performed during the hospital encounter of 11/28/21 (from the past 24 hour(s))  Urinalysis, Routine w reflex microscopic Urine, Clean Catch     Status: Abnormal   Collection Time: 11/28/21  8:08 AM  Result Value Ref Range   Color, Urine YELLOW YELLOW   APPearance CLEAR CLEAR   Specific Gravity, Urine 1.010 1.005 - 1.030   pH 6.5 5.0 - 8.0   Glucose, UA NEGATIVE NEGATIVE mg/dL   Hgb urine dipstick NEGATIVE NEGATIVE   Bilirubin Urine NEGATIVE NEGATIVE   Ketones, ur 40 (A) NEGATIVE mg/dL   Protein, ur NEGATIVE NEGATIVE mg/dL   Nitrite NEGATIVE NEGATIVE   Leukocytes,Ua NEGATIVE NEGATIVE  Wet prep, genital     Status: None   Collection Time: 11/28/21  8:37 AM  Result Value Ref Range   Yeast Wet Prep HPF POC NONE SEEN NONE SEEN   Trich, Wet Prep NONE SEEN NONE SEEN   Clue Cells Wet Prep HPF POC NONE SEEN NONE SEEN   WBC, Wet Prep HPF POC <10 <10   Sperm NONE SEEN   Fetal fibronectin     Status: Abnormal   Collection Time: 11/28/21  8:37 AM  Result Value Ref Range   Fetal Fibronectin POSITIVE (A) NEGATIVE   MAU Course  Procedures  MDM Labs ordered and  reviewed.  1030: Reports less frequent ctx. Cervix rechecked and unchanged, FFN pos, will give BMZ.  1130: Discussed presentation and clinical findings with Dr. Mora Appl, ok for outpt mngt. Assessment and Plan   1. [redacted] weeks gestation of pregnancy   2. NST (non-stress test) reactive   3. Threatened premature labor in third trimester    Discharge home Follow up at [facility] this week as scheduled Strict PTL precautions Pelvic rest OOW until Friday- note provided  Allergies as of 11/28/2021   No Known Allergies      Medication List     TAKE these medications    cholecalciferol 25 MCG (1000 UNIT) tablet Commonly known as: VITAMIN D3 Take 1,000 Units by mouth daily.   hydroxyprogesterone caproate 250 mg/mL Oil injection Commonly known as: MAKENA Inject 250 mg into the muscle once a week.   multivitamin-prenatal 27-0.8 MG Tabs tablet Take 1 tablet by mouth daily at 12 noon.       Donette Larry, CNM 11/28/2021, 12:21 PM

## 2021-11-28 NOTE — MAU Note (Signed)
Patient arrived to MAU complaining of contractions that started last night at 1030pm. Patient stated that the contractions are every 20 minutes. NO vaginal bleeding, and or leakage of fluid.  +FM  reported.

## 2021-11-29 ENCOUNTER — Other Ambulatory Visit: Payer: Self-pay

## 2021-11-29 ENCOUNTER — Inpatient Hospital Stay (HOSPITAL_COMMUNITY)
Admission: AD | Admit: 2021-11-29 | Discharge: 2021-11-29 | Disposition: A | Discharge status: Home / Self Care | Payer: Medicaid Other | Attending: Obstetrics and Gynecology | Admitting: Obstetrics and Gynecology

## 2021-11-29 DIAGNOSIS — O09523 Supervision of elderly multigravida, third trimester: Secondary | ICD-10-CM | POA: Insufficient documentation

## 2021-11-29 DIAGNOSIS — O09893 Supervision of other high risk pregnancies, third trimester: Secondary | ICD-10-CM | POA: Diagnosis not present

## 2021-11-29 DIAGNOSIS — Z3A32 32 weeks gestation of pregnancy: Secondary | ICD-10-CM

## 2021-11-29 DIAGNOSIS — O09213 Supervision of pregnancy with history of pre-term labor, third trimester: Secondary | ICD-10-CM | POA: Diagnosis not present

## 2021-11-29 DIAGNOSIS — O4703 False labor before 37 completed weeks of gestation, third trimester: Secondary | ICD-10-CM

## 2021-11-29 LAB — GC/CHLAMYDIA PROBE AMP (~~LOC~~) NOT AT ARMC
Chlamydia: NEGATIVE
Comment: NEGATIVE
Comment: NORMAL
Neisseria Gonorrhea: NEGATIVE

## 2021-11-29 LAB — CULTURE, OB URINE
Culture: NO GROWTH
Special Requests: NORMAL

## 2021-11-29 MED ORDER — BETAMETHASONE SOD PHOS & ACET 6 (3-3) MG/ML IJ SUSP
12.0000 mg | Freq: Once | INTRAMUSCULAR | Status: AC
  Administered 2021-11-29: 12 mg via INTRAMUSCULAR

## 2021-11-29 NOTE — MAU Provider Note (Signed)
Event Date/Time   First Provider Initiated Contact with Patient 11/29/21 1052      S Ms. Olivia Gordon is a 36 y.o. [id number] patient who presents to MAU today for second betamethasone injection. Was seen yesterday in MAU for threatened preterm labor & has history of preterm delivery x 2. She currently denies contractions. No LOF or vaginal bleeding. Reports good fetal movement. Has appointment with [facility] on Friday.    O BP 123/69 (BP Location: Right Arm)    Pulse 93    Temp 97.6 F (36.4 C) (Oral)    Resp 19    Ht 5\' 5"  (1.651 m)    Wt 83.7 kg    LMP 04/17/2021    SpO2 99%    BMI 30.72 kg/m  Physical Exam Vitals and nursing note reviewed.  Constitutional:      General: She is not in acute distress.    Appearance: Normal appearance.  HENT:     Head: Normocephalic and atraumatic.  Eyes:     General: No scleral icterus.    Conjunctiva/sclera: Conjunctivae normal.  Pulmonary:     Effort: Pulmonary effort is normal. No respiratory distress.  Neurological:     Mental Status: She is alert.  Psychiatric:        Mood and Affect: Mood normal.        Behavior: Behavior normal.   FHT 139 per doppler   A Medical screening exam complete 1. Hx of preterm delivery, currently pregnant, third trimester   2. Threatened preterm labor, third trimester   3. [redacted] weeks gestation of pregnancy      P Discharge from MAU in stable condition BMZ given without incident Reviewed PTL precautions & reasons to return to MAU Keep f/u appointment with OB as scheduled   06/17/2021, NP 11/29/2021 10:54 AM

## 2021-11-29 NOTE — MAU Note (Signed)
Presents for 2nd BMZ injection.  Denies LOF or VB.  Endorses +FM.

## 2021-11-30 DIAGNOSIS — O3433 Maternal care for cervical incompetence, third trimester: Secondary | ICD-10-CM | POA: Diagnosis not present

## 2021-12-01 DIAGNOSIS — N764 Abscess of vulva: Secondary | ICD-10-CM | POA: Diagnosis not present

## 2021-12-01 DIAGNOSIS — Z331 Pregnant state, incidental: Secondary | ICD-10-CM | POA: Diagnosis not present

## 2021-12-01 DIAGNOSIS — O368199 Decreased fetal movements, unspecified trimester, other fetus: Secondary | ICD-10-CM | POA: Diagnosis not present

## 2021-12-01 DIAGNOSIS — O43129 Velamentous insertion of umbilical cord, unspecified trimester: Secondary | ICD-10-CM | POA: Diagnosis not present

## 2021-12-01 DIAGNOSIS — Z3A32 32 weeks gestation of pregnancy: Secondary | ICD-10-CM | POA: Diagnosis not present

## 2021-12-01 DIAGNOSIS — Z8719 Personal history of other diseases of the digestive system: Secondary | ICD-10-CM | POA: Diagnosis not present

## 2021-12-01 DIAGNOSIS — O09523 Supervision of elderly multigravida, third trimester: Secondary | ICD-10-CM | POA: Diagnosis not present

## 2021-12-01 DIAGNOSIS — A609 Anogenital herpesviral infection, unspecified: Secondary | ICD-10-CM | POA: Diagnosis not present

## 2021-12-01 DIAGNOSIS — K4090 Unilateral inguinal hernia, without obstruction or gangrene, not specified as recurrent: Secondary | ICD-10-CM | POA: Diagnosis not present

## 2021-12-01 DIAGNOSIS — Z8619 Personal history of other infectious and parasitic diseases: Secondary | ICD-10-CM | POA: Diagnosis not present

## 2021-12-01 DIAGNOSIS — Z8751 Personal history of pre-term labor: Secondary | ICD-10-CM | POA: Diagnosis not present

## 2021-12-07 DIAGNOSIS — Z8751 Personal history of pre-term labor: Secondary | ICD-10-CM | POA: Diagnosis not present

## 2021-12-13 DIAGNOSIS — Z419 Encounter for procedure for purposes other than remedying health state, unspecified: Secondary | ICD-10-CM | POA: Diagnosis not present

## 2021-12-14 DIAGNOSIS — Z8751 Personal history of pre-term labor: Secondary | ICD-10-CM | POA: Diagnosis not present

## 2021-12-20 DIAGNOSIS — Z8751 Personal history of pre-term labor: Secondary | ICD-10-CM | POA: Diagnosis not present

## 2021-12-20 DIAGNOSIS — Z8719 Personal history of other diseases of the digestive system: Secondary | ICD-10-CM | POA: Diagnosis not present

## 2021-12-20 DIAGNOSIS — Z331 Pregnant state, incidental: Secondary | ICD-10-CM | POA: Diagnosis not present

## 2021-12-20 DIAGNOSIS — E559 Vitamin D deficiency, unspecified: Secondary | ICD-10-CM | POA: Diagnosis not present

## 2021-12-20 DIAGNOSIS — Z8619 Personal history of other infectious and parasitic diseases: Secondary | ICD-10-CM | POA: Diagnosis not present

## 2021-12-20 DIAGNOSIS — Z3A35 35 weeks gestation of pregnancy: Secondary | ICD-10-CM | POA: Diagnosis not present

## 2021-12-20 DIAGNOSIS — K4090 Unilateral inguinal hernia, without obstruction or gangrene, not specified as recurrent: Secondary | ICD-10-CM | POA: Diagnosis not present

## 2021-12-20 DIAGNOSIS — A609 Anogenital herpesviral infection, unspecified: Secondary | ICD-10-CM | POA: Diagnosis not present

## 2022-01-01 DIAGNOSIS — Z3686 Encounter for antenatal screening for cervical length: Secondary | ICD-10-CM | POA: Diagnosis not present

## 2022-01-01 DIAGNOSIS — Z113 Encounter for screening for infections with a predominantly sexual mode of transmission: Secondary | ICD-10-CM | POA: Diagnosis not present

## 2022-01-01 DIAGNOSIS — J019 Acute sinusitis, unspecified: Secondary | ICD-10-CM | POA: Diagnosis not present

## 2022-01-01 LAB — OB RESULTS CONSOLE GBS: GBS: NEGATIVE

## 2022-01-01 LAB — OB RESULTS CONSOLE GC/CHLAMYDIA: Gonorrhea: NEGATIVE

## 2022-01-10 DIAGNOSIS — Z419 Encounter for procedure for purposes other than remedying health state, unspecified: Secondary | ICD-10-CM | POA: Diagnosis not present

## 2022-01-17 DIAGNOSIS — O26849 Uterine size-date discrepancy, unspecified trimester: Secondary | ICD-10-CM | POA: Diagnosis not present

## 2022-01-17 DIAGNOSIS — Z3A39 39 weeks gestation of pregnancy: Secondary | ICD-10-CM | POA: Diagnosis not present

## 2022-01-18 ENCOUNTER — Other Ambulatory Visit: Payer: Self-pay | Admitting: Obstetrics & Gynecology

## 2022-01-22 ENCOUNTER — Telehealth (HOSPITAL_COMMUNITY): Payer: Self-pay | Admitting: *Deleted

## 2022-01-23 NOTE — Telephone Encounter (Signed)
Preadmission screen  

## 2022-01-24 ENCOUNTER — Telehealth (HOSPITAL_COMMUNITY): Payer: Self-pay | Admitting: *Deleted

## 2022-01-24 NOTE — Telephone Encounter (Signed)
Preadmission screen  

## 2022-01-25 ENCOUNTER — Telehealth (HOSPITAL_COMMUNITY): Payer: Self-pay | Admitting: *Deleted

## 2022-01-25 NOTE — Telephone Encounter (Signed)
Preadmission screen  

## 2022-01-26 ENCOUNTER — Telehealth (HOSPITAL_COMMUNITY): Payer: Self-pay | Admitting: *Deleted

## 2022-01-26 ENCOUNTER — Encounter (HOSPITAL_COMMUNITY): Payer: Self-pay | Admitting: *Deleted

## 2022-01-26 NOTE — Telephone Encounter (Signed)
Preadmission screen  

## 2022-01-29 ENCOUNTER — Inpatient Hospital Stay (HOSPITAL_COMMUNITY): Payer: Medicaid Other | Admitting: Anesthesiology

## 2022-01-29 ENCOUNTER — Inpatient Hospital Stay (HOSPITAL_COMMUNITY): Payer: Medicaid Other

## 2022-01-29 ENCOUNTER — Other Ambulatory Visit: Payer: Self-pay

## 2022-01-29 ENCOUNTER — Inpatient Hospital Stay (HOSPITAL_COMMUNITY)
Admission: AD | Admit: 2022-01-29 | Discharge: 2022-02-01 | DRG: 787 | Disposition: A | Discharge status: Home / Self Care | Payer: Medicaid Other | Attending: Obstetrics & Gynecology | Admitting: Obstetrics & Gynecology

## 2022-01-29 ENCOUNTER — Encounter (HOSPITAL_COMMUNITY): Payer: Self-pay | Admitting: Obstetrics and Gynecology

## 2022-01-29 DIAGNOSIS — Z3A41 41 weeks gestation of pregnancy: Secondary | ICD-10-CM | POA: Diagnosis not present

## 2022-01-29 DIAGNOSIS — Z98891 History of uterine scar from previous surgery: Principal | ICD-10-CM

## 2022-01-29 DIAGNOSIS — O480 Post-term pregnancy: Principal | ICD-10-CM | POA: Diagnosis present

## 2022-01-29 HISTORY — DX: Abscess of vulva: N76.4

## 2022-01-29 HISTORY — DX: Chronic cholecystitis: K81.1

## 2022-01-29 HISTORY — DX: Unilateral inguinal hernia, without obstruction or gangrene, not specified as recurrent: K40.90

## 2022-01-29 HISTORY — DX: Abnormal levels of other serum enzymes: R74.8

## 2022-01-29 HISTORY — DX: Calculus of gallbladder without cholecystitis without obstruction: K80.20

## 2022-01-29 LAB — CBC
HCT: 40.1 % (ref 36.0–46.0)
Hemoglobin: 13.6 g/dL (ref 12.0–15.0)
MCH: 32.6 pg (ref 26.0–34.0)
MCHC: 33.9 g/dL (ref 30.0–36.0)
MCV: 96.2 fL (ref 80.0–100.0)
Platelets: 158 10*3/uL (ref 150–400)
RBC: 4.17 MIL/uL (ref 3.87–5.11)
RDW: 12.9 % (ref 11.5–15.5)
WBC: 5.2 10*3/uL (ref 4.0–10.5)
nRBC: 0 % (ref 0.0–0.2)

## 2022-01-29 LAB — TYPE AND SCREEN
ABO/RH(D): O POS
Antibody Screen: NEGATIVE

## 2022-01-29 MED ORDER — OXYTOCIN BOLUS FROM INFUSION
333.0000 mL | Freq: Once | INTRAVENOUS | Status: DC
Start: 2022-01-29 — End: 2022-01-30

## 2022-01-29 MED ORDER — OXYTOCIN-SODIUM CHLORIDE 30-0.9 UT/500ML-% IV SOLN
1.0000 m[IU]/min | INTRAVENOUS | Status: DC
  Administered 2022-01-29: 2 m[IU]/min via INTRAVENOUS
  Filled 2022-01-29: qty 500

## 2022-01-29 MED ORDER — DIPHENHYDRAMINE HCL 50 MG/ML IJ SOLN: 12.5000 mg | INTRAMUSCULAR | Status: DC | PRN

## 2022-01-29 MED ORDER — OXYTOCIN-SODIUM CHLORIDE 30-0.9 UT/500ML-% IV SOLN
2.5000 [IU]/h | INTRAVENOUS | Status: DC
Start: 2022-01-29 — End: 2022-01-30

## 2022-01-29 MED ORDER — LACTATED RINGERS IV SOLN: INTRAVENOUS | Status: DC

## 2022-01-29 MED ORDER — SODIUM CHLORIDE 0.9 % IV SOLN
12.5000 mg | Freq: Four times a day (QID) | INTRAVENOUS | Status: DC | PRN
  Administered 2022-01-30: 12.5 mg via INTRAVENOUS
  Filled 2022-01-29: qty 12.5

## 2022-01-29 MED ORDER — ACETAMINOPHEN 325 MG PO TABS: 650.0000 mg | ORAL_TABLET | ORAL | Status: DC | PRN

## 2022-01-29 MED ORDER — FENTANYL CITRATE (PF) 100 MCG/2ML IJ SOLN
50.0000 ug | INTRAMUSCULAR | Status: DC | PRN
  Administered 2022-01-29: 50 ug via INTRAVENOUS

## 2022-01-29 MED ORDER — LIDOCAINE HCL (PF) 1 % IJ SOLN
INTRAMUSCULAR | Status: DC | PRN
  Administered 2022-01-29: 8 mL via EPIDURAL

## 2022-01-29 MED ORDER — FENTANYL-BUPIVACAINE-NACL 0.5-0.125-0.9 MG/250ML-% EP SOLN
12.0000 mL/h | EPIDURAL | Status: DC | PRN
  Administered 2022-01-29: 12 mL/h via EPIDURAL
  Filled 2022-01-29: qty 250

## 2022-01-29 MED ORDER — LACTATED RINGERS IV SOLN
500.0000 mL | INTRAVENOUS | Status: DC | PRN
  Administered 2022-01-30: 500 mL via INTRAVENOUS

## 2022-01-29 MED ORDER — FENTANYL-BUPIVACAINE-NACL 0.5-0.125-0.9 MG/250ML-% EP SOLN: 12.0000 mL/h | EPIDURAL | Status: DC | PRN

## 2022-01-29 MED ORDER — TERBUTALINE SULFATE 1 MG/ML IJ SOLN
0.2500 mg | Freq: Once | INTRAMUSCULAR | Status: AC | PRN
  Administered 2022-01-30: 0.25 mg via SUBCUTANEOUS
  Filled 2022-01-29: qty 1

## 2022-01-29 MED ORDER — SOD CITRATE-CITRIC ACID 500-334 MG/5ML PO SOLN
30.0000 mL | ORAL | Status: DC | PRN
Start: 2022-01-29 — End: 2022-01-30

## 2022-01-29 MED ORDER — LACTATED RINGERS IV SOLN
500.0000 mL | Freq: Once | INTRAVENOUS | Status: AC
  Administered 2022-01-29: 500 mL via INTRAVENOUS

## 2022-01-29 MED ORDER — ONDANSETRON HCL 4 MG/2ML IJ SOLN
4.0000 mg | Freq: Four times a day (QID) | INTRAMUSCULAR | Status: DC | PRN
  Administered 2022-01-29 – 2022-01-30 (×2): 4 mg via INTRAVENOUS
  Filled 2022-01-29 (×2): qty 2

## 2022-01-29 MED ORDER — EPHEDRINE 5 MG/ML INJ: 10.0000 mg | INTRAVENOUS | Status: DC | PRN

## 2022-01-29 MED ORDER — FENTANYL CITRATE (PF) 100 MCG/2ML IJ SOLN
INTRAMUSCULAR | Status: AC
  Filled 2022-01-29: qty 2

## 2022-01-29 MED ORDER — PHENYLEPHRINE 40 MCG/ML (10ML) SYRINGE FOR IV PUSH (FOR BLOOD PRESSURE SUPPORT): 80.0000 ug | PREFILLED_SYRINGE | INTRAVENOUS | Status: DC | PRN

## 2022-01-29 MED ORDER — LIDOCAINE HCL (PF) 1 % IJ SOLN: 30.0000 mL | INTRAMUSCULAR | Status: DC | PRN

## 2022-01-29 NOTE — Anesthesia Preprocedure Evaluation (Addendum)
Anesthesia Evaluation  ?Patient identified by MRN, date of birth, ID band ?Patient awake ? ? ? ?Reviewed: ?Allergy & Precautions, H&P , NPO status , Patient's Chart, lab work & pertinent test results, reviewed documented beta blocker date and time  ? ?Airway ?Mallampati: II ? ?TM Distance: >3 FB ?Neck ROM: full ? ? ? Dental ?no notable dental hx. ? ?  ?Pulmonary ?neg pulmonary ROS,  ?  ?Pulmonary exam normal ?breath sounds clear to auscultation ? ? ? ? ? ? Cardiovascular ?negative cardio ROS ?Normal cardiovascular exam ?Rhythm:regular Rate:Normal ? ? ?  ?Neuro/Psych ?negative neurological ROS ? negative psych ROS  ? GI/Hepatic ?negative GI ROS, Neg liver ROS,   ?Endo/Other  ?negative endocrine ROS ? Renal/GU ?negative Renal ROS  ?negative genitourinary ?  ?Musculoskeletal ? ? Abdominal ?  ?Peds ? Hematology ?negative hematology ROS ?(+)   ?Anesthesia Other Findings ? ? Reproductive/Obstetrics ?(+) Pregnancy ? ?  ? ? ? ? ? ? ? ? ? ? ? ? ? ?  ?  ? ? ? ? ? ? ? ? ?Anesthesia Physical ?Anesthesia Plan ? ?ASA: 2 ? ?Anesthesia Plan: Epidural  ? ?Post-op Pain Management:   ? ?Induction:  ? ?PONV Risk Score and Plan: 2 ? ?Airway Management Planned: Natural Airway ? ?Additional Equipment: None ? ?Intra-op Plan:  ? ?Post-operative Plan:  ? ?Informed Consent: I have reviewed the patients History and Physical, chart, labs and discussed the procedure including the risks, benefits and alternatives for the proposed anesthesia with the patient or authorized representative who has indicated his/her understanding and acceptance.  ? ? ? ? ? ?Plan Discussed with: Anesthesiologist ? ?Anesthesia Plan Comments: ( ?UPDATE 01/30/22 3:55 PM: Patient to go to OR for C section due to arrest of descent. Labor epidural in place and functioning well. Will plan to use epidural for surgical anesthesia. Discussed with patient. )  ? ? ? ? ? ?Anesthesia Quick Evaluation ? ?

## 2022-01-29 NOTE — Anesthesia Procedure Notes (Signed)
Epidural ?Patient location during procedure: OB ?Start time: 01/29/2022 10:45 PM ?End time: 01/29/2022 10:50 PM ? ?Staffing ?Anesthesiologist: Bethena Midget, MD ? ?Preanesthetic Checklist ?Completed: patient identified, IV checked, site marked, risks and benefits discussed, surgical consent, monitors and equipment checked, pre-op evaluation and timeout performed ? ?Epidural ?Patient position: sitting ?Prep: DuraPrep and site prepped and draped ?Patient monitoring: continuous pulse ox and blood pressure ?Approach: midline ?Location: L3-L4 ?Injection technique: LOR air ? ?Needle:  ?Needle type: Tuohy  ?Needle gauge: 17 G ?Needle length: 9 cm and 9 ?Needle insertion depth: 5 cm ?Catheter type: closed end flexible ?Catheter size: 19 Gauge ?Catheter at skin depth: 10 cm ?Test dose: negative ? ?Assessment ?Events: blood not aspirated, injection not painful, no injection resistance, no paresthesia and negative IV test ? ? ? ?

## 2022-01-29 NOTE — Progress Notes (Signed)
Subjective:   ? ?Comfortable with epidural ? ?Objective:   ? ?VS: BP 120/71   Pulse 64   Temp 98.5 ?F (36.9 ?C) (Oral)   Resp 18   Ht 5\' 5"  (1.651 m)   Wt 85.5 kg   LMP 04/17/2021   SpO2 99%   BMI 31.37 kg/m?  ?FHR : baseline 145 / variability moderate / accelerations present / no decelerations ?Toco: contractions every 1-3 minutes  ?Membranes: intact ?Dilation: 4.5 ?Effacement (%): 90 ?Cervical Position: anterior ?Station: -2 ?Presentation: Vertex ?Exam by:: Randon Goldsmith, RN ?Pitocin 4 mU/min ? ?Assessment/Plan:  ? ?36 y.o. [id number] [redacted]w[redacted]d here for IOL for postdates. ? ?Labor: Will titrate pitocin as needed. Will plan on AROM with no change at next check ?Fetal Wellbeing:  Category I ?Pain Control:  Epidural ?I/D:   GBS negative ?Anticipated MOD:  NSVD ? ?Kathalene Frames MSN, CNM ?01/29/2022 11:31 PM  ?

## 2022-01-29 NOTE — Progress Notes (Signed)
Subjective:   ? ?Feeling uncomfortable with contractions but coping well ? ?Objective:   ? ?VS: BP 127/78   Pulse 76   Temp 98.5 ?F (36.9 ?C) (Oral)   Resp 18   Ht 5\' 5"  (1.651 m)   Wt 85.5 kg   LMP 04/17/2021   BMI 31.37 kg/m?  ?FHR : baseline 140 / variability moderate / accelerations present / no decelerations ?Toco: contractions every 3-4 minutes ?Membranes: intact ?Dilation: 4 ?Effacement (%): 80 ?Cervical Position: Posterior ?Station: -2 ?Presentation: Vertex ?Exam by:: [email or web address] ?Pitocin 2 mU/min ? ?Assessment/Plan:  ? ?36 y.o. 30 [redacted]w[redacted]d ? ?Labor: Progressing on Pitocin, will continue to increase then AROM ?Preeclampsia:  normotensive, no symptoms ?Fetal Wellbeing:  Category I ?Pain Control:  Labor support without medications ?I/D:   GBS negative ?Anticipated MOD:  NSVD ? ?[redacted]w[redacted]d MSN, CNM ?01/29/2022 8:21 PM  ?

## 2022-01-29 NOTE — H&P (Signed)
OB ADMISSION HISTORY & PHYSICAL ? ?Admission Date: 01/29/2022  5:25 PM  ?Admit Diagnosis: post dates ? ?Olivia Gordon is a 36 y.o. female [phone] [redacted]w[redacted]d presenting for scheduled induction of labor. Endorses active FM, denies LOF and vaginal bleeding.  ? ?History of current pregnancy: ?G3P0202   ?Prenatal Care with: [facility] ?Patient entered prenatal care at 12 wks.   ?EDC 3/13/233 by  12 wk U/S.   ?Anatomy scan:  20 wks, complete w/ anterior placenta.   ? 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 ?Patient Active Problem List  ? Diagnosis Date Noted  ? Post-dates pregnancy 01/29/2022  ? SVD (spontaneous vaginal delivery) 10/17/2019  ? Abnormal liver function tests 10/12/2019  ? Preterm labor 10/12/2019  ? Abscess of vulva 05/17/2019  ? Inguinal hernia 05/17/2019  ? History of syphilis 04/23/2019  ? Pregnant 04/21/2019  ? History of premature delivery 04/17/2019  ? Cholelithiasis with chronic cholecystitis 07/11/2017  ? History of pancreatitis 07/11/2017  ? ? ?Prenatal Labs: ?ABO, Rh: --/--/PENDING (03/20 1753) ?Antibody: PENDING (03/20 1753) ?Rubella: Immune (08/30 0000)  ?RPR: Reactive (08/30 0000)  ?HBsAg: Negative (08/30 0000)  ?HIV: Non-reactive (08/30 0000)  ?GBS: Negative/-- (02/20 0000)  ? ?Vaccines: ?Tdap: declined ?Flu declined ?Covid: declined ? ?Prenatal Transfer Tool  ?Maternal Diabetes: No ?Genetic Screening: Normal ?Maternal Ultrasounds/Referrals: Normal ?Fetal Ultrasounds or other Referrals:  Referred to Materal Fetal Medicine  ?Maternal Substance Abuse:  No ?Significant Maternal Medications:  None Valtrex ?Significant Maternal Lab Results:  Group B Strep negative ?Other Comments:  None ? ?OB History  ?Gravida Para Term Preterm AB Living  ?3 2 0 2 0 2  ?SAB IAB Ectopic Multiple Live Births  ?0 0 0 0 2  ?  ?# Outcome Date GA Lbr Len/2nd Weight Sex Delivery Anes PTL Lv  ?3 Current           ?2 Preterm 10/15/19 [redacted]w[redacted]d 26:19 / 00:11 2190 g F Vag-Spont None  LIV  ?1  Preterm  [redacted]w[redacted]d    Vag-Spont   LIV  ? ? ?Medical / Surgical History: ?Past medical history:  ?Past Medical History:  ?Diagnosis Date  ? Abnormal liver enzymes   ? Abscess of vulva   ? Cholelithiasis   ? Chronic cholecystitis   ? Inguinal hernia   ? Pancreatitis   ? Syphilis   ?  ?Past surgical history:  ?Past Surgical History:  ?Procedure Laterality Date  ? CHOLECYSTECTOMY N/A 07/18/2017  ? Procedure: LAPAROSCOPIC CHOLECYSTECTOMY WITH INTRAOPERATIVE CHOLANGIOGRAM;  Surgeon: Darnell Level, MD;  Location: [facility];  Service: General;  Laterality: N/A;  ? ERCP Left 07/19/2017  ? Procedure: ENDOSCOPIC RETROGRADE CHOLANGIOPANCREATOGRAPHY (ERCP);  Surgeon: Kerin Salen, MD;  Location: [facility];  Service: Gastroenterology;  Laterality: Left;  ? ?Family History:  ?Family History  ?Problem Relation Age of Onset  ? Healthy Mother   ? Healthy Father   ?  ?Social History:  reports that she has never smoked. She has never used smokeless tobacco. She reports that she does not currently use alcohol. She reports current drug use. ? ?Allergies: ?Patient has no known allergies. ?  ?Current Medications at time of admission:  ?Prior to Admission medications   ?Medication Sig Start Date End Date Taking? Authorizing Provider  ?cholecalciferol (VITAMIN D3) 25 MCG (1000 UNIT) tablet Take 1,000 Units by mouth daily.    [provider]  ?hydroxyprogesterone caproate (MAKENA) 250 mg/mL OIL injection Inject 250 mg into the muscle once a week.    [provider]  ?Prenatal Vit-Fe Fumarate-FA (MULTIVITAMIN-PRENATAL) 27-0.8 MG TABS tablet Take 1 tablet by mouth daily at 12 noon.    [provider]  ? ? ?Review of Systems: ?Constitutional: Negative   ?HENT: Negative   ?Eyes: Negative   ?Respiratory: Negative   ?Cardiovascular: Negative   ?Gastrointestinal: Negative  ?Genitourinary: neg for bloody show, neg for LOF   ?Musculoskeletal: Negative   ?Skin: Negative   ?Neurological: Negative   ?Endo/Heme/Allergies: Negative    ?Psychiatric/Behavioral: Negative  ? ? ?Physical Exam: ?VS: Blood pressure 132/83, pulse 72, temperature 98.5 ?F (36.9 ?C), temperature source Oral, resp. rate 18, height 5\' 5"  (1.651 m), weight 85.5 kg, last menstrual period 04/17/2021, unknown if currently breastfeeding. ?AAO x3, no signs of distress ?Cardiovascular: RRR ?Respiratory: Lung fields clear to ausculation ?GU/GI: Abdomen gravid, non-tender, non-distended, active FM, vertex, EFW 3000 grams per Leopold's ?Extremities: no edema, negative for pain, tenderness, and cords ? ?Cervical exam:  ?FHR: baseline rate 145 / variability moderate / accelerations present /absent decelerations ?TOCO: q1-2 min ? ?  ? ? ?Assessment: ?36 y.o. 30 [redacted]w[redacted]d admitted for IOL for post dates ? ? ?FHR category 1 ?GBS neg ? ? ?Plan:  ?Admit to Labor and Delivery ?Routine admission orders ?Epidural on maternal demand ?IV hydration ?Continuous monitoring ?Pitocin for induction ?Anticipate NSVD delivery ? ? ?[redacted]w[redacted]d MD ?01/29/2022 6:10 PM  ?

## 2022-01-30 ENCOUNTER — Encounter (HOSPITAL_COMMUNITY)
Admission: AD | Disposition: A | Discharge status: Home / Self Care | Payer: Self-pay | Source: Home / Self Care | Attending: Obstetrics & Gynecology

## 2022-01-30 ENCOUNTER — Other Ambulatory Visit: Payer: Self-pay

## 2022-01-30 ENCOUNTER — Encounter (HOSPITAL_COMMUNITY): Payer: Self-pay | Admitting: Obstetrics & Gynecology

## 2022-01-30 ENCOUNTER — Inpatient Hospital Stay (HOSPITAL_COMMUNITY): Payer: Medicaid Other

## 2022-01-30 DIAGNOSIS — K3189 Other diseases of stomach and duodenum: Secondary | ICD-10-CM | POA: Diagnosis not present

## 2022-01-30 DIAGNOSIS — Z3A41 41 weeks gestation of pregnancy: Secondary | ICD-10-CM

## 2022-01-30 DIAGNOSIS — K5989 Other specified functional intestinal disorders: Secondary | ICD-10-CM | POA: Diagnosis not present

## 2022-01-30 DIAGNOSIS — O368390 Maternal care for abnormalities of the fetal heart rate or rhythm, unspecified trimester, not applicable or unspecified: Secondary | ICD-10-CM | POA: Diagnosis not present

## 2022-01-30 LAB — COMPREHENSIVE METABOLIC PANEL
ALT: 14 U/L (ref 0–44)
ALT: 16 U/L (ref 0–44)
AST: 29 U/L (ref 15–41)
AST: 29 U/L (ref 15–41)
Albumin: 2.5 g/dL — ABNORMAL LOW (ref 3.5–5.0)
Albumin: 2.6 g/dL — ABNORMAL LOW (ref 3.5–5.0)
Alkaline Phosphatase: 93 U/L (ref 38–126)
Alkaline Phosphatase: 80 U/L (ref 38–126)
Anion gap: 9 (ref 5–15)
Anion gap: 8 (ref 5–15)
BUN: 8 mg/dL (ref 6–20)
BUN: 8 mg/dL (ref 6–20)
CO2: 19 mmol/L — ABNORMAL LOW (ref 22–32)
CO2: 18 mmol/L — ABNORMAL LOW (ref 22–32)
Calcium: 8.2 mg/dL — ABNORMAL LOW (ref 8.9–10.3)
Calcium: 8.5 mg/dL — ABNORMAL LOW (ref 8.9–10.3)
Chloride: 110 mmol/L (ref 98–111)
Chloride: 113 mmol/L — ABNORMAL HIGH (ref 98–111)
Creatinine, Ser: 1.34 mg/dL — ABNORMAL HIGH (ref 0.44–1.00)
Creatinine, Ser: 1.01 mg/dL — ABNORMAL HIGH (ref 0.44–1.00)
GFR, Estimated: 52 mL/min — ABNORMAL LOW (ref >60)
GFR, Estimated: >60 mL/min (ref >60)
Glucose, Bld: 92 mg/dL (ref 70–99)
Glucose, Bld: 105 mg/dL — ABNORMAL HIGH (ref 70–99)
Potassium: 3.5 mmol/L (ref 3.5–5.1)
Potassium: 4 mmol/L (ref 3.5–5.1)
Sodium: 138 mmol/L (ref 135–145)
Sodium: 139 mmol/L (ref 135–145)
Total Bilirubin: 0.9 mg/dL (ref 0.3–1.2)
Total Bilirubin: 0.8 mg/dL (ref 0.3–1.2)
Total Protein: 5.1 g/dL — ABNORMAL LOW (ref 6.5–8.1)
Total Protein: 5.2 g/dL — ABNORMAL LOW (ref 6.5–8.1)

## 2022-01-30 LAB — CBC
HCT: 34.2 % — ABNORMAL LOW (ref 36.0–46.0)
Hemoglobin: 11.5 g/dL — ABNORMAL LOW (ref 12.0–15.0)
MCH: 32.2 pg (ref 26.0–34.0)
MCHC: 33.6 g/dL (ref 30.0–36.0)
MCV: 95.8 fL (ref 80.0–100.0)
Platelets: 141 10*3/uL — ABNORMAL LOW (ref 150–400)
RBC: 3.57 MIL/uL — ABNORMAL LOW (ref 3.87–5.11)
RDW: 12.8 % (ref 11.5–15.5)
WBC: 11.7 10*3/uL — ABNORMAL HIGH (ref 4.0–10.5)
nRBC: 0 % (ref 0.0–0.2)

## 2022-01-30 LAB — POCT I-STAT EG7
Acid-base deficit: 7 mmol/L — ABNORMAL HIGH (ref 0.0–2.0)
Bicarbonate: 17 mmol/L — ABNORMAL LOW (ref 20.0–28.0)
Calcium, Ion: 1.2 mmol/L (ref 1.15–1.40)
HCT: 29 % — ABNORMAL LOW (ref 36.0–46.0)
Hemoglobin: 9.9 g/dL — ABNORMAL LOW (ref 12.0–15.0)
O2 Saturation: 88 %
Potassium: 3.3 mmol/L — ABNORMAL LOW (ref 3.5–5.1)
Sodium: 137 mmol/L (ref 135–145)
TCO2: 18 mmol/L — ABNORMAL LOW (ref 22–32)
pCO2, Ven: 29.9 mmHg — ABNORMAL LOW (ref 44–60)
pH, Ven: 7.363 (ref 7.25–7.43)
pO2, Ven: 56 mmHg — ABNORMAL HIGH (ref 32–45)

## 2022-01-30 LAB — RPR
RPR Ser Ql: REACTIVE — AB
RPR Titer: 1:1 {titer}

## 2022-01-30 SURGERY — Surgical Case
Anesthesia: Epidural | Wound class: Clean Contaminated

## 2022-01-30 MED ORDER — TRANEXAMIC ACID-NACL 1000-0.7 MG/100ML-% IV SOLN
INTRAVENOUS | Status: DC | PRN
  Administered 2022-01-30 (×2): 1000 mg via INTRAVENOUS

## 2022-01-30 MED ORDER — METHYLERGONOVINE MALEATE 0.2 MG/ML IJ SOLN
INTRAMUSCULAR | Status: AC
  Filled 2022-01-30: qty 1

## 2022-01-30 MED ORDER — SODIUM CHLORIDE 0.9 % IR SOLN
Status: DC | PRN
  Administered 2022-01-30: 1

## 2022-01-30 MED ORDER — SODIUM CHLORIDE 0.9 % IV SOLN: INTRAVENOUS | Status: DC | PRN

## 2022-01-30 MED ORDER — METHYLENE BLUE 0.5 % INJ SOLN
INTRAVENOUS | Status: AC
  Filled 2022-01-30: qty 10

## 2022-01-30 MED ORDER — STERILE WATER FOR IRRIGATION IR SOLN
Status: DC | PRN
  Administered 2022-01-30: 1000 mL

## 2022-01-30 MED ORDER — HYDROMORPHONE HCL 1 MG/ML IJ SOLN
INTRAMUSCULAR | Status: AC
Start: 2022-01-30 — End: ?
  Filled 2022-01-30: qty 0.5

## 2022-01-30 MED ORDER — OXYTOCIN-SODIUM CHLORIDE 30-0.9 UT/500ML-% IV SOLN
INTRAVENOUS | Status: DC | PRN
  Administered 2022-01-30: 200 mL via INTRAVENOUS
  Administered 2022-01-30: 600 mL via INTRAVENOUS

## 2022-01-30 MED ORDER — SODIUM CHLORIDE 0.9 % IV SOLN
INTRAVENOUS | Status: DC | PRN
  Administered 2022-01-30: 500 mg via INTRAVENOUS

## 2022-01-30 MED ORDER — KETOROLAC TROMETHAMINE 30 MG/ML IJ SOLN: 30.0000 mg | Freq: Four times a day (QID) | INTRAMUSCULAR | Status: AC | PRN

## 2022-01-30 MED ORDER — DEXAMETHASONE SODIUM PHOSPHATE 4 MG/ML IJ SOLN
INTRAMUSCULAR | Status: AC
  Filled 2022-01-30: qty 1

## 2022-01-30 MED ORDER — CEFAZOLIN SODIUM-DEXTROSE 2-4 GM/100ML-% IV SOLN
INTRAVENOUS | Status: AC
  Filled 2022-01-30: qty 100

## 2022-01-30 MED ORDER — ACETAMINOPHEN 500 MG PO TABS
1000.0000 mg | ORAL_TABLET | Freq: Four times a day (QID) | ORAL | Status: DC
  Administered 2022-01-31 – 2022-02-01 (×6): 1000 mg via ORAL
  Filled 2022-01-30 (×6): qty 2

## 2022-01-30 MED ORDER — CARBOPROST TROMETHAMINE 250 MCG/ML IM SOLN
INTRAMUSCULAR | Status: DC | PRN
  Administered 2022-01-30: 250 ug via INTRAMUSCULAR

## 2022-01-30 MED ORDER — OXYCODONE HCL 5 MG PO TABS: 5.0000 mg | ORAL_TABLET | Freq: Once | ORAL | Status: DC | PRN

## 2022-01-30 MED ORDER — GABAPENTIN 100 MG PO CAPS
100.0000 mg | ORAL_CAPSULE | Freq: Three times a day (TID) | ORAL | Status: DC
  Administered 2022-01-30 – 2022-02-01 (×5): 100 mg via ORAL
  Filled 2022-01-30 (×5): qty 1

## 2022-01-30 MED ORDER — DIPHENHYDRAMINE HCL 25 MG PO CAPS: 25.0000 mg | ORAL_CAPSULE | Freq: Four times a day (QID) | ORAL | Status: DC | PRN

## 2022-01-30 MED ORDER — FENTANYL CITRATE (PF) 100 MCG/2ML IJ SOLN
INTRAMUSCULAR | Status: DC | PRN
  Administered 2022-01-30: 100 ug via EPIDURAL

## 2022-01-30 MED ORDER — METHYLERGONOVINE MALEATE 0.2 MG PO TABS: 0.2000 mg | ORAL_TABLET | ORAL | Status: DC | PRN

## 2022-01-30 MED ORDER — PHENYLEPHRINE 40 MCG/ML (10ML) SYRINGE FOR IV PUSH (FOR BLOOD PRESSURE SUPPORT)
PREFILLED_SYRINGE | INTRAVENOUS | Status: AC
  Filled 2022-01-30: qty 10

## 2022-01-30 MED ORDER — SIMETHICONE 80 MG PO CHEW
80.0000 mg | CHEWABLE_TABLET | Freq: Three times a day (TID) | ORAL | Status: DC
  Administered 2022-01-31 – 2022-02-01 (×4): 80 mg via ORAL
  Filled 2022-01-30 (×5): qty 1

## 2022-01-30 MED ORDER — ALBUMIN HUMAN 5 % IV SOLN: INTRAVENOUS | Status: DC | PRN

## 2022-01-30 MED ORDER — MEPERIDINE HCL 25 MG/ML IJ SOLN: 6.2500 mg | INTRAMUSCULAR | Status: DC | PRN

## 2022-01-30 MED ORDER — MENTHOL 3 MG MT LOZG: 1.0000 | LOZENGE | OROMUCOSAL | Status: DC | PRN

## 2022-01-30 MED ORDER — ONDANSETRON HCL 4 MG/2ML IJ SOLN
INTRAMUSCULAR | Status: AC
  Filled 2022-01-30: qty 2

## 2022-01-30 MED ORDER — DIPHENHYDRAMINE HCL 50 MG/ML IJ SOLN
12.5000 mg | INTRAMUSCULAR | Status: DC | PRN
Start: 2022-01-30 — End: 2022-02-01

## 2022-01-30 MED ORDER — CEFAZOLIN SODIUM-DEXTROSE 2-3 GM-%(50ML) IV SOLR
INTRAVENOUS | Status: DC | PRN
  Administered 2022-01-30: 2 g via INTRAVENOUS

## 2022-01-30 MED ORDER — DEXAMETHASONE SODIUM PHOSPHATE 10 MG/ML IJ SOLN
INTRAMUSCULAR | Status: DC | PRN
Start: 2022-01-30 — End: 2022-01-30
  Administered 2022-01-30: 4 mg via INTRAVENOUS

## 2022-01-30 MED ORDER — PHENYLEPHRINE 40 MCG/ML (10ML) SYRINGE FOR IV PUSH (FOR BLOOD PRESSURE SUPPORT)
PREFILLED_SYRINGE | INTRAVENOUS | Status: DC | PRN
  Administered 2022-01-30 (×3): 80 ug via INTRAVENOUS
  Administered 2022-01-30: 120 ug via INTRAVENOUS

## 2022-01-30 MED ORDER — ACETAMINOPHEN 10 MG/ML IV SOLN: 1000.0000 mg | Freq: Once | INTRAVENOUS | Status: DC | PRN

## 2022-01-30 MED ORDER — FENTANYL CITRATE (PF) 100 MCG/2ML IJ SOLN
INTRAMUSCULAR | Status: AC
  Filled 2022-01-30: qty 2

## 2022-01-30 MED ORDER — METHYLENE BLUE 0.5 % INJ SOLN
INTRAVENOUS | Status: DC | PRN
  Administered 2022-01-30 (×2): 5 mL via INTRAVENOUS

## 2022-01-30 MED ORDER — HYDROMORPHONE HCL 1 MG/ML IJ SOLN
0.2500 mg | INTRAMUSCULAR | Status: DC | PRN
  Administered 2022-01-30: 0.25 mg via INTRAVENOUS

## 2022-01-30 MED ORDER — LACTATED RINGERS IV SOLN: INTRAVENOUS | Status: DC

## 2022-01-30 MED ORDER — OXYCODONE HCL 5 MG PO TABS
5.0000 mg | ORAL_TABLET | ORAL | Status: DC | PRN
  Administered 2022-01-31 (×2): 5 mg via ORAL
  Filled 2022-01-30 (×2): qty 1

## 2022-01-30 MED ORDER — DIPHENOXYLATE-ATROPINE 2.5-0.025 MG PO TABS
ORAL_TABLET | ORAL | Status: AC
  Filled 2022-01-30: qty 1

## 2022-01-30 MED ORDER — OXYTOCIN-SODIUM CHLORIDE 30-0.9 UT/500ML-% IV SOLN
INTRAVENOUS | Status: AC
  Filled 2022-01-30: qty 500

## 2022-01-30 MED ORDER — SIMETHICONE 80 MG PO CHEW: 80.0000 mg | CHEWABLE_TABLET | ORAL | Status: DC | PRN

## 2022-01-30 MED ORDER — ONDANSETRON HCL 4 MG/2ML IJ SOLN: 4.0000 mg | Freq: Three times a day (TID) | INTRAMUSCULAR | Status: DC | PRN

## 2022-01-30 MED ORDER — SCOPOLAMINE 1 MG/3DAYS TD PT72: 1.0000 | MEDICATED_PATCH | Freq: Once | TRANSDERMAL | Status: DC

## 2022-01-30 MED ORDER — WITCH HAZEL-GLYCERIN EX PADS: 1.0000 "application " | MEDICATED_PAD | CUTANEOUS | Status: DC | PRN

## 2022-01-30 MED ORDER — BUPIVACAINE HCL (PF) 0.25 % IJ SOLN
INTRAMUSCULAR | Status: AC
  Filled 2022-01-30: qty 30

## 2022-01-30 MED ORDER — LIDOCAINE-EPINEPHRINE (PF) 2 %-1:200000 IJ SOLN
INTRAMUSCULAR | Status: AC
  Filled 2022-01-30: qty 20

## 2022-01-30 MED ORDER — BUPIVACAINE HCL 0.5 % IJ SOLN
INTRAMUSCULAR | Status: DC | PRN
  Administered 2022-01-30: 30 mL

## 2022-01-30 MED ORDER — IBUPROFEN 600 MG PO TABS
600.0000 mg | ORAL_TABLET | Freq: Four times a day (QID) | ORAL | Status: DC
  Administered 2022-01-31 – 2022-02-01 (×2): 600 mg via ORAL
  Filled 2022-01-30 (×2): qty 1

## 2022-01-30 MED ORDER — NALOXONE HCL 4 MG/10ML IJ SOLN
1.0000 ug/kg/h | INTRAVENOUS | Status: DC | PRN
  Filled 2022-01-30: qty 5

## 2022-01-30 MED ORDER — LIDOCAINE-EPINEPHRINE (PF) 2 %-1:200000 IJ SOLN
INTRAMUSCULAR | Status: DC | PRN
  Administered 2022-01-30: 5 mL via EPIDURAL
  Administered 2022-01-30: 3 mL via EPIDURAL
  Administered 2022-01-30: 5 mL via EPIDURAL

## 2022-01-30 MED ORDER — OXYTOCIN-SODIUM CHLORIDE 30-0.9 UT/500ML-% IV SOLN: 2.5000 [IU]/h | INTRAVENOUS | Status: AC

## 2022-01-30 MED ORDER — MORPHINE SULFATE (PF) 0.5 MG/ML IJ SOLN
INTRAMUSCULAR | Status: DC | PRN
  Administered 2022-01-30: 3 mg via EPIDURAL

## 2022-01-30 MED ORDER — TRANEXAMIC ACID-NACL 1000-0.7 MG/100ML-% IV SOLN
INTRAVENOUS | Status: AC
  Filled 2022-01-30: qty 100

## 2022-01-30 MED ORDER — ZOLPIDEM TARTRATE 5 MG PO TABS: 5.0000 mg | ORAL_TABLET | Freq: Every evening | ORAL | Status: DC | PRN

## 2022-01-30 MED ORDER — DIPHENOXYLATE-ATROPINE 2.5-0.025 MG PO TABS
2.0000 | ORAL_TABLET | ORAL | Status: AC
  Administered 2022-01-30: 2 via ORAL

## 2022-01-30 MED ORDER — SODIUM CHLORIDE 0.9% FLUSH: 3.0000 mL | INTRAVENOUS | Status: DC | PRN

## 2022-01-30 MED ORDER — PROMETHAZINE HCL 25 MG/ML IJ SOLN
INTRAMUSCULAR | Status: DC | PRN
  Administered 2022-01-30 (×2): 12.5 mg via INTRAVENOUS

## 2022-01-30 MED ORDER — DIBUCAINE (PERIANAL) 1 % EX OINT: 1.0000 "application " | TOPICAL_OINTMENT | CUTANEOUS | Status: DC | PRN

## 2022-01-30 MED ORDER — ACETAMINOPHEN 10 MG/ML IV SOLN
INTRAVENOUS | Status: DC | PRN
Start: 2022-01-30 — End: 2022-01-30
  Administered 2022-01-30: 1000 mg via INTRAVENOUS

## 2022-01-30 MED ORDER — CARBOPROST TROMETHAMINE 250 MCG/ML IM SOLN
INTRAMUSCULAR | Status: AC
  Filled 2022-01-30: qty 1

## 2022-01-30 MED ORDER — PROMETHAZINE HCL 25 MG/ML IJ SOLN
INTRAMUSCULAR | Status: AC
  Filled 2022-01-30: qty 1

## 2022-01-30 MED ORDER — METHYLERGONOVINE MALEATE 0.2 MG/ML IJ SOLN
INTRAMUSCULAR | Status: DC | PRN
Start: 2022-01-30 — End: 2022-01-30
  Administered 2022-01-30 (×2): .2 mg via INTRAMUSCULAR

## 2022-01-30 MED ORDER — ACETAMINOPHEN 10 MG/ML IV SOLN
INTRAVENOUS | Status: AC
  Filled 2022-01-30: qty 100

## 2022-01-30 MED ORDER — NALOXONE HCL 0.4 MG/ML IJ SOLN: 0.4000 mg | INTRAMUSCULAR | Status: DC | PRN

## 2022-01-30 MED ORDER — METHYLERGONOVINE MALEATE 0.2 MG/ML IJ SOLN: 0.2000 mg | INTRAMUSCULAR | Status: DC | PRN

## 2022-01-30 MED ORDER — COCONUT OIL OIL: 1.0000 "application " | TOPICAL_OIL | Status: DC | PRN

## 2022-01-30 MED ORDER — DIPHENHYDRAMINE HCL 25 MG PO CAPS: 25.0000 mg | ORAL_CAPSULE | ORAL | Status: DC | PRN

## 2022-01-30 MED ORDER — HYDROMORPHONE HCL 1 MG/ML IJ SOLN: 0.2000 mg | INTRAMUSCULAR | Status: DC | PRN

## 2022-01-30 MED ORDER — PROCHLORPERAZINE EDISYLATE 10 MG/2ML IJ SOLN
10.0000 mg | Freq: Once | INTRAMUSCULAR | Status: DC | PRN
  Filled 2022-01-30: qty 2

## 2022-01-30 MED ORDER — OXYCODONE HCL 5 MG/5ML PO SOLN: 5.0000 mg | Freq: Once | ORAL | Status: DC | PRN

## 2022-01-30 MED ORDER — ONDANSETRON HCL 4 MG/2ML IJ SOLN
INTRAMUSCULAR | Status: DC | PRN
  Administered 2022-01-30: 4 mg via INTRAVENOUS

## 2022-01-30 MED ORDER — BUPIVACAINE HCL (PF) 0.5 % IJ SOLN
INTRAMUSCULAR | Status: AC
  Filled 2022-01-30: qty 30

## 2022-01-30 MED ORDER — SENNOSIDES-DOCUSATE SODIUM 8.6-50 MG PO TABS
2.0000 | ORAL_TABLET | ORAL | Status: DC
  Administered 2022-01-31 – 2022-02-01 (×2): 2 via ORAL
  Filled 2022-01-30 (×2): qty 2

## 2022-01-30 MED ORDER — PRENATAL MULTIVITAMIN CH
1.0000 | ORAL_TABLET | Freq: Every day | ORAL | Status: DC
  Administered 2022-01-31 – 2022-02-01 (×2): 1 via ORAL
  Filled 2022-01-30 (×2): qty 1

## 2022-01-30 MED ORDER — ALBUMIN HUMAN 5 % IV SOLN
INTRAVENOUS | Status: AC
  Filled 2022-01-30: qty 250

## 2022-01-30 MED ORDER — TETANUS-DIPHTH-ACELL PERTUSSIS 5-2.5-18.5 LF-MCG/0.5 IM SUSY: 0.5000 mL | PREFILLED_SYRINGE | Freq: Once | INTRAMUSCULAR | Status: DC

## 2022-01-30 MED ORDER — KETOROLAC TROMETHAMINE 30 MG/ML IJ SOLN
30.0000 mg | Freq: Four times a day (QID) | INTRAMUSCULAR | Status: AC
  Administered 2022-01-31 (×4): 30 mg via INTRAVENOUS
  Filled 2022-01-30 (×4): qty 1

## 2022-01-30 MED ORDER — FENTANYL CITRATE (PF) 100 MCG/2ML IJ SOLN
INTRAMUSCULAR | Status: DC | PRN
  Administered 2022-01-30: 50 ug via INTRAVENOUS

## 2022-01-30 MED ORDER — IOHEXOL 300 MG/ML  SOLN
125.0000 mL | Freq: Once | INTRAMUSCULAR | Status: AC | PRN
  Administered 2022-01-30: 125 mL via INTRAVENOUS

## 2022-01-30 MED ORDER — MORPHINE SULFATE (PF) 0.5 MG/ML IJ SOLN
INTRAMUSCULAR | Status: AC
  Filled 2022-01-30: qty 10

## 2022-01-30 SURGICAL SUPPLY — 39 items
BENZOIN TINCTURE PRP APPL 2/3 (GAUZE/BANDAGES/DRESSINGS) ×2 IMPLANT
CHLORAPREP W/TINT 26ML (MISCELLANEOUS) ×2 IMPLANT
CLAMP CORD UMBIL (MISCELLANEOUS) IMPLANT
CLOSURE STERI STRIP 1/2 X4 (GAUZE/BANDAGES/DRESSINGS) ×1 IMPLANT
CLOTH BEACON ORANGE TIMEOUT ST (SAFETY) ×2 IMPLANT
DRSG OPSITE POSTOP 4X10 (GAUZE/BANDAGES/DRESSINGS) ×2 IMPLANT
ELECT REM PT RETURN 9FT ADLT (ELECTROSURGICAL) ×2
ELECTRODE REM PT RTRN 9FT ADLT (ELECTROSURGICAL) ×1 IMPLANT
EXTRACTOR VACUUM KIWI (MISCELLANEOUS) IMPLANT
GLOVE BIO SURGEON STRL SZ 6.5 (GLOVE) ×2 IMPLANT
GLOVE BIOGEL PI IND STRL 7.0 (GLOVE) ×2 IMPLANT
GLOVE BIOGEL PI INDICATOR 7.0 (GLOVE) ×2
GOWN STRL REUS W/TWL LRG LVL3 (GOWN DISPOSABLE) ×6 IMPLANT
HEMOSTAT ARISTA ABSORB 3G PWDR (HEMOSTASIS) ×1 IMPLANT
KIT ABG SYR 3ML LUER SLIP (SYRINGE) IMPLANT
NDL HYPO 25X5/8 SAFETYGLIDE (NEEDLE) IMPLANT
NEEDLE HYPO 22GX1.5 SAFETY (NEEDLE) IMPLANT
NEEDLE HYPO 25X5/8 SAFETYGLIDE (NEEDLE) IMPLANT
NS IRRIG 1000ML POUR BTL (IV SOLUTION) ×2 IMPLANT
PACK C SECTION WH (CUSTOM PROCEDURE TRAY) ×2 IMPLANT
PAD OB MATERNITY 4.3X12.25 (PERSONAL CARE ITEMS) ×2 IMPLANT
RETAINER VISCERAL (MISCELLANEOUS) ×1 IMPLANT
RTRCTR C-SECT PINK 25CM LRG (MISCELLANEOUS) ×2 IMPLANT
SPONGE COVER 4X4 NONSTERILE (GAUZE/BANDAGES/DRESSINGS) ×2 IMPLANT
STRIP CLOSURE SKIN 1/2X4 (GAUZE/BANDAGES/DRESSINGS) ×2 IMPLANT
SUT CHROMIC 2 0 CT 1 (SUTURE) ×4 IMPLANT
SUT MNCRL 0 VIOLET CTX 36 (SUTURE) ×2 IMPLANT
SUT MONOCRYL 0 CTX 36 (SUTURE) ×8
SUT PDS AB 0 CTX 60 (SUTURE) ×2 IMPLANT
SUT PLAIN 2 0 (SUTURE) ×2
SUT PLAIN ABS 2-0 CT1 27XMFL (SUTURE) IMPLANT
SUT VIC AB 0 CTX 36 (SUTURE) ×4
SUT VIC AB 0 CTX36XBRD ANBCTRL (SUTURE) ×2 IMPLANT
SUT VIC AB 2-0 CT1 (SUTURE) ×1 IMPLANT
SUT VIC AB 4-0 KS 27 (SUTURE) ×2 IMPLANT
SYR CONTROL 10ML LL (SYRINGE) IMPLANT
TOWEL OR 17X24 6PK STRL BLUE (TOWEL DISPOSABLE) ×2 IMPLANT
TRAY FOLEY W/BAG SLVR 14FR LF (SET/KITS/TRAYS/PACK) ×2 IMPLANT
WATER STERILE IRR 1000ML POUR (IV SOLUTION) ×2 IMPLANT

## 2022-01-30 NOTE — Progress Notes (Signed)
MD LABOR PROGRESS NOTE ? ?Subjective:   ?Patient feeling more rectal pressure ? ?Objective:   ? ?VS: BP 121/81   Pulse 87   Temp 97.6 ?F (36.4 ?C) (Oral)   Resp 18   Ht 5\' 5"  (1.651 m)   Wt 85.5 kg   LMP 04/17/2021   SpO2 99%   BMI 31.37 kg/m?  ?FHR : baseline 140 / variability moderate / accelerations present  / absent decelerations ?Toco: contractions every 1-2 minutes / MVU >210 ? ?Dilation: 10 ?Dilation Complete Date: 01/30/22 ?Dilation Complete Time: 1208 ?Effacement (%): 80 ?Cervical Position:  (asynclitic) ?Station: -1 ?Presentation: Vertex ?Exam by:: Tavius Turgeon ?Pitocin 18 mU/min ? ?Assessment/Plan:  ? ?37 y.o. [id number] [redacted]w[redacted]d now in second stage ?Will start pushing with patient.  ? ?Anticipated MOD:  NSVD ? ?Sanjuana Kava MD ?01/30/2022 12:12 PM  ?

## 2022-01-30 NOTE — Anesthesia Postprocedure Evaluation (Signed)
Anesthesia Post Note ? ?Patient: Olivia Gordon ? ?Procedure(s) Performed: CESAREAN SECTION ? ?  ? ?Patient location during evaluation: PACU ?Anesthesia Type: Epidural ?Level of consciousness: oriented and awake and alert ?Pain management: pain level controlled ?Vital Signs Assessment: post-procedure vital signs reviewed and stable ?Respiratory status: spontaneous breathing, respiratory function stable and nonlabored ventilation ?Cardiovascular status: blood pressure returned to baseline and stable ?Postop Assessment: no headache, no backache, no apparent nausea or vomiting, epidural receding and patient able to bend at knees ?Anesthetic complications: no ? ? ?No notable events documented. ? ?Last Vitals:  ?Vitals:  ? 01/30/22 1847 01/30/22 1900  ?BP: (!) 141/84 (!) 136/96  ?Pulse: 93 90  ?Resp: (!) 21 (!) 21  ?Temp:    ?SpO2: 98% 99%  ?  ?Last Pain:  ?Vitals:  ? 01/30/22 1847  ?TempSrc:   ?PainSc: 0-No pain  ? ?Pain Goal:   ? ?  ?  ?  ?  ?  ?  ?Epidural/Spinal Function Cutaneous sensation: Normal sensation (01/30/22 1847), Patient able to flex knees: Yes (01/30/22 1847), Patient able to lift hips off bed: Yes (01/30/22 1847), Back pain beyond tenderness at insertion site: No (01/30/22 1847), Progressively worsening motor and/or sensory loss: No (01/30/22 1847), Bowel and/or bladder incontinence post epidural: No (01/30/22 1847) ? ?Loreal Schuessler A. ? ? ? ? ?

## 2022-01-30 NOTE — Progress Notes (Signed)
? ?  Called to room for 10 min prolonged deceleration. FHR recovered with IV bolus and position change. Attempt x 3 to place FSE but monitors do not seem to be working. Able to trace FHR externally.  ? ?Objective:   ? ?VS: BP (!) 100/46   Pulse 76   Temp 97.6 ?F (36.4 ?C) (Oral)   Resp 18   Ht 5\' 5"  (1.651 m)   Wt 85.5 kg   LMP 04/17/2021   SpO2 99%   BMI 31.37 kg/m?  ?FHR : baseline 150 / variability moderate / accelerations present / variable decelerations ?Toco: contractions every 2-4 minutes  ?Membranes: SROM, mec ?Dilation: 9 ?Effacement (%): 100 ?Cervical Position: Anterior ?Station: 0 ?Presentation: Vertex ?Exam by:: [email or web address], CNM ?Pitocin 0 mU/min ? ?Assessment/Plan:  ? ?36 y.o. 30 [redacted]w[redacted]d here for IOL for postdates.  ? ?Labor:  Active labor ?Fetal Wellbeing:  Cat 1 ?Pain Control:  Epidural ?I/D:   GBS neg ?Anticipated MOD:  NSVD ? ?Dr. [redacted]w[redacted]d called and strip and POC reviewed.  ? ?Normand Sloop MSN, CNM ?01/30/2022 5:23 AM  ?

## 2022-01-30 NOTE — Progress Notes (Signed)
MD Preoperative Note ? ?Procedure:  Primary cesarean section  ? ?Preoperative Diagnosis: Arrest of descent ? ?Counseling:  ? ?Patient counseled that a cesarean section is a major surgery to deliver the baby through an incision in the abdominal wall and uterus.  ?She was counseled that this can be associated with risk and unforeseen complications.  ?These risks and complications of a cesarean section include, but are not limited to:  ? ?-Infection of the uterus, pelvic organs, or skin. Infection is the most common complication of a Cesarean section.  ?-inadvertent injury to internal organs, such as bowel or bladder. If there is major injury, extensive surgery may be required. Bowel injury may require a colostomy for repair, bladder requiring prolonged catheterization, ?ureters requiring stent placement, or other pelvic/abdominal organs, the vessels or nerves.  ?-Blood loss possibly requiring a blood transfusion. Minor to moderate bleeding usually can be controlled.  ?  Performing a hysterectomy for uncontrolled bleeding is rare.  ?-Blood clots can develop in the legs, pelvic organs, or lungs. Clotting can be life threatening. Sequential compression devices, SCD?s, are used during and after surgery to minimize the risk of clotting.  ?-Possible wound breakdown or less than satisfactory healing of scar. ?-An increased risk of a Cesarean section in subsequent pregnancies.  ?-Accidental laceration of the baby while making the incision in the uterus ? ?The patient voiced understanding of the procedure and its attendant risks, and gives informed consent to proceed.   ?Consent signed, witnessed and placed into chart The patient was given the opportunity to ask questions, and her concerns were adequately addressed.  ? ?Plan:  ?On call to OR ?Ancef 2 gM IV pre op ?Abdomen clipped ?Bicitra given ?Foley already in place ? ?Sanjuana Kava MD ?3:40 PM ? ? ? ? ?         ?  ? ? ? ?   ?

## 2022-01-30 NOTE — Transfer of Care (Signed)
Immediate Anesthesia Transfer of Care Note ? ?Patient: Olivia Gordon ? ?Procedure(s) Performed: CESAREAN SECTION ? ?Patient Location: PACU ? ?Anesthesia Type:Spinal and Epidural ? ?Level of Consciousness: awake ? ?Airway & Oxygen Therapy: Patient Spontanous Breathing ? ?Post-op Assessment: Report given to RN ? ?Post vital signs: Reviewed and stable ? ?Last Vitals:  ?Vitals Value Taken Time  ?BP    ?Temp    ?Pulse 83 01/30/22 1825  ?Resp 18 01/30/22 1825  ?SpO2 100 % 01/30/22 1825  ?Vitals shown include unvalidated device data. ? ?Last Pain:  ?Vitals:  ? 01/30/22 1200  ?TempSrc:   ?PainSc: 0-No pain  ?   ? ?  ? ?Complications: No notable events documented. ?

## 2022-01-30 NOTE — Progress Notes (Signed)
MD LABOR PROGRESS NOTE ? ?Subjective:   ?Patient pushing ? ?Objective:   ? ?VS: BP (!) 143/68   Pulse 83   Temp 98.4 ?F (36.9 ?C)   Resp 18   Ht 5\' 5"  (1.651 m)   Wt 85.5 kg   LMP 04/17/2021   SpO2 99%   BMI 31.37 kg/m?  ?FHR : baseline 140 / variability moderate / accelerations absent / deep variable decels present with pushing   ? ?Assessment/Plan:  ? ?36 y.o. 30 [redacted]w[redacted]d pushing ?Discussed with patient that the head is not descending well perhaps due to size of fetus versus position being occiput posterior.  ?Discussed risks of operative vaginal delivery with patient.  ?Discussed also risk of shoulder dystocia with patient.  ? ?[redacted]w[redacted]d MD ?01/30/2022 1:40 PM  ?

## 2022-01-30 NOTE — Progress Notes (Signed)
Subjective:   ? ?Called to room to assess vaginal bleeding. Patient resting in bed, comfortable with epidural. Abdomen soft between contractions.  ? ?Objective:   ? ?VS: BP 122/72   Pulse 67   Temp 98.5 ?F (36.9 ?C) (Oral)   Resp 18   Ht 5\' 5"  (1.651 m)   Wt 85.5 kg   LMP 04/17/2021   SpO2 99%   BMI 31.37 kg/m?  ?FHR : baseline 140 / variability moderate / accelerations present / no decelerations ?Toco: contractions every 2-3 minutes / ?Membranes: intact ?Dilation: 6.5 ?Effacement (%): 90 ?Cervical Position: Anterior ?Station: -2, -1 ?Presentation: Vertex ?Exam by:: [email or web address], RN ?Pitocin 4 mU/min ? ?Assessment/Plan:  ? ?36 y.o. 30 [redacted]w[redacted]d here for IOL for postdates.  ? ? ?Labor: Progressing normally. ?Fetal Wellbeing:  Category I ?Pain Control:  Epidural ?Anticipated MOD:  NSVD ? ?[redacted]w[redacted]d MSN, CNM ?01/30/2022 1:43 AM  ?

## 2022-01-30 NOTE — Progress Notes (Signed)
Subjective:   ? ?Sleeping in bed ? ?Objective:   ? ?VS: BP 119/60   Pulse 80   Temp 97.6 ?F (36.4 ?C) (Oral)   Resp 18   Ht 5\' 5"  (1.651 m)   Wt 85.5 kg   LMP 04/17/2021   SpO2 99%   BMI 31.37 kg/m?  ?FHR : baseline 150 / variability moderate / accelerations present / variable decelerations ?Toco: contractions every 3-5 minutes  ?Membranes: SROM, mec ?Dilation: 9 ?Effacement (%): 100 ?Cervical Position: Anterior ?Station: 0 ?Presentation: Vertex ?Exam by:: E., Mearle Drew, CNM ?Pitocin 0 mU/min ? ?Assessment/Plan:  ? ?36 y.o. 30 [redacted]w[redacted]d here for IOL for postdates. No cervical change since last check. Will restart pitocin.  ? ?Labor:  Active ?Fetal Wellbeing:  Category I ?Pain Control:  Epidural ?Anticipated MOD:  NSVD ? ?Will handoff to Dr. [redacted]w[redacted]d at 7am ? ?Mora Appl MSN, CNM ?01/30/2022 6:35 AM  ?

## 2022-01-30 NOTE — Progress Notes (Signed)
MD LABOR PROGRESS NOTE- on coming coverage note ? ?Olivia Gordon is a 36 y.o. [id number] at [redacted]w[redacted]d  admitted for post dates induction.  ?Overnight per sign out there was an episode of fetal bradycardia nadir 60's for 10 minutes with return to baseline after position change. Per sign-out patient with intermittent episodes of late decelerations and pitocin was turned off.  Multiple FSE devices were attempted to be placed, however the devices were not working.  ? ? ?Subjective:  ?Patient comfortable with epidural ? ? ?Objective:  ?BP (!) 108/54   Pulse 73   Temp 97.6 ?F (36.4 ?C) (Oral)   Resp 18   Ht 5\' 5"  (1.651 m)   Wt 85.5 kg   LMP 04/17/2021   SpO2 99%   BMI 31.37 kg/m?  ? ?FHT:  Baseline 140's moderate variability no accels no decels in the last hour. Several intermittent subtle late decelerations over the past 5 hours.  ?UC:   irregular q3-7 min ?SVE:   Dilation: 5.5 ?Effacement (%): 70 ?Station: -3 ?Exam by:: Dr. [email or web address] ?IUPC placed.  ?Fetal position is OP with full cervix palpated along both sides and full length.  ? ?Pitocin @ 2 mu/min ? ?Labs: ?Lab Results  ?Component Value Date  ? WBC 5.2 01/29/2022  ? HGB 13.6 01/29/2022  ? HCT 40.1 01/29/2022  ? MCV 96.2 01/29/2022  ? PLT 158 01/29/2022  ? ? ?Assessment / Plan: ?36 y.o. 30 [redacted]w[redacted]d not in active labor ?Difficulty getting patient adequate with pitocin secondary to  ?Category II tracing. IUPC placed will now monitor MVUs.  ?Discussed with patient that due to the bloody nature of the amniotic ?Fluid, there is a concern for partial abruption.  ?Counseled that if there is another prolonged episode of bradycardia,  ?I would advise a primary cesarean section.  ?She agrees.  ? ? ? ?[redacted]w[redacted]d Amous Crewe ? ?01/30/2022 ?7:44 AM ?  ?

## 2022-01-31 ENCOUNTER — Encounter (HOSPITAL_COMMUNITY): Payer: Self-pay | Admitting: Obstetrics & Gynecology

## 2022-01-31 DIAGNOSIS — O480 Post-term pregnancy: Secondary | ICD-10-CM | POA: Diagnosis not present

## 2022-01-31 DIAGNOSIS — Z3A41 41 weeks gestation of pregnancy: Secondary | ICD-10-CM | POA: Diagnosis not present

## 2022-01-31 LAB — CBC WITH DIFFERENTIAL/PLATELET
Abs Immature Granulocytes: 0.1 10*3/uL — ABNORMAL HIGH (ref 0.00–0.07)
Basophils Absolute: 0 10*3/uL (ref 0.0–0.1)
Basophils Relative: 0 %
Eosinophils Absolute: 0 10*3/uL (ref 0.0–0.5)
Eosinophils Relative: 0 %
HCT: 29.4 % — ABNORMAL LOW (ref 36.0–46.0)
Hemoglobin: 10.3 g/dL — ABNORMAL LOW (ref 12.0–15.0)
Immature Granulocytes: 1 %
Lymphocytes Relative: 8 %
Lymphs Abs: 1 10*3/uL (ref 0.7–4.0)
MCH: 33.4 pg (ref 26.0–34.0)
MCHC: 35 g/dL (ref 30.0–36.0)
MCV: 95.5 fL (ref 80.0–100.0)
Monocytes Absolute: 1 10*3/uL (ref 0.1–1.0)
Monocytes Relative: 8 %
Neutro Abs: 10.8 10*3/uL — ABNORMAL HIGH (ref 1.7–7.7)
Neutrophils Relative %: 83 %
Platelets: 129 10*3/uL — ABNORMAL LOW (ref 150–400)
RBC: 3.08 MIL/uL — ABNORMAL LOW (ref 3.87–5.11)
RDW: 13.1 % (ref 11.5–15.5)
WBC: 12.9 10*3/uL — ABNORMAL HIGH (ref 4.0–10.5)
nRBC: 0 % (ref 0.0–0.2)

## 2022-01-31 LAB — COMPREHENSIVE METABOLIC PANEL
ALT: 17 U/L (ref 0–44)
AST: 33 U/L (ref 15–41)
Albumin: 2.4 g/dL — ABNORMAL LOW (ref 3.5–5.0)
Alkaline Phosphatase: 82 U/L (ref 38–126)
Anion gap: 8 (ref 5–15)
BUN: 5 mg/dL — ABNORMAL LOW (ref 6–20)
CO2: 18 mmol/L — ABNORMAL LOW (ref 22–32)
Calcium: 8.7 mg/dL — ABNORMAL LOW (ref 8.9–10.3)
Chloride: 113 mmol/L — ABNORMAL HIGH (ref 98–111)
Creatinine, Ser: 0.82 mg/dL (ref 0.44–1.00)
GFR, Estimated: >60 mL/min (ref >60)
Glucose, Bld: 97 mg/dL (ref 70–99)
Potassium: 3.8 mmol/L (ref 3.5–5.1)
Sodium: 139 mmol/L (ref 135–145)
Total Bilirubin: 0.6 mg/dL (ref 0.3–1.2)
Total Protein: 5.1 g/dL — ABNORMAL LOW (ref 6.5–8.1)

## 2022-01-31 LAB — T.PALLIDUM AB, TOTAL: T Pallidum Abs: REACTIVE — AB

## 2022-01-31 NOTE — Op Note (Signed)
Olivia Gordon ? ?DOB:    September 17, 1986 ? ?MRN:    [id number] ? ?Date of Surgery:  01/30/2022 ? ?Indication: 41 week SIUP admitted for induction of labor taken back to operating room after no fetal descent of fetal vertex with pushing after several hours and persistent occiput posterior position. There was also evidence of fetal distress with recurrent late decelerations. Pitocin stopped and terbutaline administered to aid with fetal recovery  ? ?Pre-operative Diagnosis: 1. Post dates pregnancy 41 weeks 2. Arrest of descent 3. Non-reassuring fetal heart tracing 4. Persistent OP position ? ?Post-operative Diagnosis: same ? ?Procedure:  Primary low transverse Olivia section; cystoscopy ?                       ?Surgeon: Wynonia Hazard, MD ? ?Assistants: Candelaria Celeste, DO ? ?Anesthesia: Epidural anesthesia ? ?ASA Class: 2 ? ?Procedure Details  ? ?The patient was counseled about the risks, benefits, complications of the Olivia section. The patient concurred with the proposed plan, giving informed consent.  The site of surgery properly noted/marked. The patient was taken to Operating Room # C, identified as Olivia Gordon and the procedure verified as C-Section Delivery. A Time Out was held and the above information confirmed. ? ?After epidural was found to adequate , the patient was placed in the dorsal supine position with a leftward tilt, draped and prepped in the usual sterile manner. A Pfannenstiel incision was made with a 10 blade scalpel and the incision carried down through the subcutaneous tissue to the fascia.  The fascia was incised in the midline and the fascial incision was extended laterally with Mayo scissors. The superior aspect of the fascial incision was grasped with Coker clamps x2, tented up and the rectus muscles dissected off sharply with the bovie.  The rectus was then dissected off with blunt dissection and the bovie inferiorly. The rectus muscles were  separated in the midline. The abdominal peritoneum was identified, and bluntly entered using surgeons fingers. The peritoneal opening was bluntly extended with gentle pulling.  ? ?The Alexis retractor was then deployed. The vesicouterine peritoneum was identified, tented up, entered sharply with Metzenbaum scissors, and the bladder flap was created digitally. Scalpel was then used to make a low transverse incision on the uterus which was extended laterally with  blunt dissection. There was thick meconium noted in the amniotic fluid. The fetal vertex was identified and delivered from OP presentation using a Kiwi vacuum.  A live female infant was born appearing limp.h Apgar scores of 4 at one minute and 9 at five minutes. The umbilical cord was quickly clamped and cut, the baby was handed off to waiting Neonatal team. Segment of umbilical cord was handed off for cord gases to be obtained. Cord blood was drawn for evaluation.  The placenta was manually removed intact. The placenta was handed off to be sent as a specimen for Pathology.  There was marked uterine atony and anesthesia team made aware. TXA was administered, IV pitocin, 2 doses of IM methergine, IM Hemabate.   ? ?The uterus was exteriorized from the abdomen and cleared of all clot and debris. There was a notable tear along the right anterior broad ligament down to the level of the uterine artery. Dr. Adrian Blackwater was called to come assist in the case. Several interrupted sutures of 0-Monocryl were placed along the laceration. Finally to control bleeding an Antony Salmon uterine artery ligation was performed on the right side  using 0- Chromic suture. The remaining uterine incision was repaired with #0 Monocryl in running locked fashion. A second imbricating suture was performed using the same suture. The incision was still oozing therefore a third imbricating suture of 2-0 vicryl was utilized incorporating the vesicouterine peritoneum.  The incision was finally made  hemostatic. Irrigation was performed and the incision re-inspected and there was no bleeding. Arista was placed over the incision.  Ovaries and tubes were inspected and normal. The Alexis retractor was removed. The abdominal cavity was cleared of all clot and debris. The abdominal peritoneum was reapproximated with 2-0 chromic  in a running fashion, the rectus muscles was reapproximated with #2 chromic in interrupted fashion.The rectus fascia closed with 0 Vicryl in a running fashion starting from each end and meeting in the middle.The subcuticular layer was irrigated and all bleeders cauterized.  30 mL of 0.5% Marcaine was injected into the subcutaneous layer.  The Scarpas fascia was re-approximated with interrupted sutures of 2-0 plain gut. ? ?The skin was closed with 4-0 vicryl in a subcuticular fashion using a Mellody Dance needle. The incision was dressed with benzoine, steri strips and pressure dressing. All sponge lap and needle counts were correct x3. Instrument, sponge, and needle counts were correct prior the abdominal closure and at the conclusion of the case.  ? ?Patient was then moved down on the operating room table and placed in dorsal lithotomy position using Allen stirrups. A 70 degree cystoscope was used to survey the bladder.  There was no sutures present in the bladder and no notable defect or injury.  The left ureter was noted to have a brisk jet however it was difficult to identify the jet of the right ureter. Methylene blue was then administered via IV by anesthesia. After 30 minutes, there was no extravasation of blue from either ureter.  The decision was made to discontinue the case and transfer the patient to PACU, obtain imaging via CT scan to assess the patency of the right ureter and consult Urology.   ?CT scan with hematuria protocol demonstrated patency of both ureters and no further concern needed to be addressed.  ? ?Patient remained hemodynamically stable throughout the case and was alert,  awake and oriented throughout. She was consistently updated throughout the procedure.  ? ? ?Findings: ?Live female infant, Apgars 4/9, thick meconium stained amniotic fluid, placenta appeared normal intact with 3 vessel cord, normal uterus, bilateral tubes and ovaries ? ?Estimated Blood Loss: 1744 mL ? ?IVF:  3000 mL LR + albumin ?        ?Drains: Foley catheter ? ?Urine output: 450 mL clear urine during case ?        ?Specimens: Placenta to Pathology ?        ?Implants: none ?        ?Complications:  None; patient tolerated the procedure well. ?        ?Disposition: PACU - hemodynamically stable. ? ?Attending Attestation: I PERFORMED THE PROCEDURE AND MY ASSISTANT WAS NEEDED FOR THE COMPLEXITY OF THE CASE  ? ?Olivia Gordon STACIA  ?

## 2022-01-31 NOTE — Progress Notes (Signed)
Subjective: ?POD# 1 ?Information for the patient's newborn:  Olivia Gordon, Olivia Gordon [[id number]]  ?female   ?Baby's Name Olivia Gordon ?Circumcision N/A ? ?Reports feeling "very good" ?Feeding: breast ?Reports tolerating PO and denies N/V, foley removed, ambulating and urinating w/o difficulty  ?Pain controlled with  PO meds ?Denies HA/SOB/dizziness  ?Flatus passing ?Vaginal bleeding is normal, no clots    ? ?Objective: ? VS:  ?Vitals:  ? 01/30/22 2211 01/30/22 2216 01/30/22 2335 01/31/22 0305  ?BP:   137/83 116/65  ?Pulse:   88 84  ?Resp:   18 18  ?Temp:   97.6 ?F (36.4 ?C) 98.1 ?F (36.7 ?C)  ?TempSrc:   Oral Oral  ?SpO2: 98% 98% 99% 99%  ?Weight:      ?Height:      ? ? ?Intake/Output Summary (Last 24 hours) at 01/31/2022 0715 ?Last data filed at 01/31/2022 [phone] ?Gross per 24 hour  ?Intake 4269.68 ml  ?Output 8869 ml  ?Net -4599.32 ml  ?   ?Recent Labs  ?  01/30/22 ?2233 01/31/22 ?0426  ?WBC 11.7* 12.9*  ?HGB 11.5* 10.3*  ?HCT 34.2* 29.4*  ?PLT 141* 129*  ? ? ?Blood type: --/--/O POS (03/20 1753) ?Rubella: Immune (08/30 0000)  ?  ?Physical Exam:  ?General: alert, cooperative, and no distress ?CV: Regular rate and rhythm ?Resp: clear ?Abdomen: soft, nontender, normal bowel sounds ?Incision:  dressing is clean, dry, and intact ?Perineum:  ?Uterine Fundus: firm, below umbilicus, nontender ?Lochia:  appropriate ?Ext: no edema, redness or tenderness in the calves or thighs ? ? ?Assessment/Plan: ?36 y.o.   POD# 1. [id number]  ?                ?Principal Problem: ?  Post-dates pregnancy ?See MD note for details of the surgery ? ?Routine post-op PP care          ?Advance diet as tolerated ?Advised warm fluids and ambulation to improve GI motility ?Breastfeeding support ? ? ?Roma Schanz, MSN, CNM ?01/31/2022, 7:15 AM ? ? ?

## 2022-01-31 NOTE — Lactation Note (Signed)
This note was copied from a baby's chart. ?Lactation Consultation Note ? ?Patient Name: Girl Notnamed Croucher ?Today's Date: 01/31/2022 ?  ?Age:39 hours ?P3, 8 hour term female infant. ?Per RN Wyonia Hough) mom declined [facility] services she is exclusively pumping.  ?Maternal Data ?Has patient been taught Hand Expression?: Yes ?Does the patient have breastfeeding experience prior to this delivery?: Yes ?How long did the patient breastfeed?:  (pt reports few months 4-47m) ? ?Feeding ?  ? ?LATCH Score ?  ? ?  ? ?  ? ?  ? ?  ? ?  ? ? ?Lactation Tools Discussed/Used ?  ? ?Interventions ?  ? ?Discharge ?  ? ?Consult Status ?  ? ? ? ?Danelle Earthly ?01/31/2022, 1:13 AM ? ? ? ?

## 2022-02-01 ENCOUNTER — Other Ambulatory Visit (HOSPITAL_COMMUNITY): Payer: Self-pay

## 2022-02-01 LAB — SURGICAL PATHOLOGY

## 2022-02-01 MED ORDER — OXYCODONE-ACETAMINOPHEN 5-325 MG PO TABS
1.0000 | ORAL_TABLET | ORAL | 0 refills | Status: AC | PRN
  Filled 2022-02-01: qty 40, 4d supply, fill #0

## 2022-02-01 MED ORDER — GABAPENTIN 100 MG PO CAPS
100.0000 mg | ORAL_CAPSULE | Freq: Three times a day (TID) | ORAL | 2 refills | Status: AC | PRN
Start: 2022-02-01 — End: ?
  Filled 2022-02-01: qty 30, 10d supply, fill #0

## 2022-02-01 MED ORDER — IBUPROFEN 600 MG PO TABS
600.0000 mg | ORAL_TABLET | Freq: Four times a day (QID) | ORAL | 3 refills | Status: AC | PRN
  Filled 2022-02-01: qty 60, 15d supply, fill #0

## 2022-02-01 NOTE — Discharge Summary (Signed)
?[facility] Discharge Summary ? ? ?Patient Name:   Olivia Gordon ?DOB:     [birth year] ?MRN:     [id number] ? ?Date of Admission:   01/29/2022 ?Date of Discharge:  02/01/2022 ? ?Admitting diagnosis:   ? ?Post-dates pregnancy [O48.0] ?Principal Problem: ?  Post-dates pregnancy ?High risk pregnancy due to h/o preterm labor ?   ?Discharge diagnosis:   ? ?Post-dates pregnancy [O48.0] ?Principal Problem: ?  Post-dates pregnancy ? ?Term Pregnancy Delivered       ? ?Additional problems: None  ?                                        ?Post partum procedures: none ?Augmentation: AROM, Pitocin, and Cytotec ?Complications: [id number] ? ?Hospital course: Induction of Labor With Cesarean Section   ?36 y.o. yo [id number] at 86w1dwas admitted to the hospital 01/29/2022 for induction of labor. Patient had a labor course significant for arrest of descent and non-reassuring fetal heart tracing. The patient went for cesarean section due to Arrest of Descent and Non-Reassuring FHR. Delivery details are as follows: ?Membrane Rupture Time/Date: 12:00 AM ,01/30/2022   ?Delivery Method:C-Section, Low Transverse  ?Details of operation can be found in separate operative Note.  Patient had an uncomplicated postpartum course. She is ambulating, tolerating a regular diet, passing flatus, and urinating well.  Patient is discharged home in stable condition on 02/01/22.     ? ?Newborn Data: ?Birth date:01/30/2022  ?Birth time:4:14 PM  ?Gender:Female  ?Living status:Living  ?Apgars:4 ,9  ?Weight:3200 g                               ? ?Magnesium Sulfate received: No ?BMZ received: No ?Rhophylac:N/A ?MMR:No ?T-DaP: no ?Flu: No ?Transfusion:No ?                                                              ?Type of Delivery:  Primary cesarean section ?Delivering Provider: PSanjuana Kava ?Date of Delivery:  01/30/22 ? ?Newborn Data:  ?Baby Feeding:   Bottle and Breast ?Disposition:   home with mother ? ?Physical Exam: ?  ?Vitals:  ?  01/31/22 1105 01/31/22 1658 01/31/22 1943 02/01/22 0757  ?BP: (!) 120/59 126/72 124/70 127/72  ?Pulse: 82 89 80 80  ?Resp: '18 19 18 18  ' ?Temp: 98 ?F (36.7 ?C) 98.2 ?F (36.8 ?C) 98 ?F (36.7 ?C) 98.3 ?F (36.8 ?C)  ?TempSrc: Oral Oral Oral Oral  ?SpO2: 99% 100% 100% 100%  ?Weight:      ?Height:      ? ?General: alert, cooperative, and no distress ?Lochia: appropriate ?Uterine Fundus: firm ?Incision: Healing well with no significant drainage, No significant erythema, Dressing is clean, dry, and intact ?DVT Evaluation: No evidence of DVT seen on physical exam. ?Negative Homan's sign. ?No cords or calf tenderness. ? ?Labs: ?Lab Results  ?Component Value Date  ? WBC 12.9 (H) 01/31/2022  ? HGB 10.3 (L) 01/31/2022  ? HCT 29.4 (L) 01/31/2022  ? MCV 95.5 01/31/2022  ? PLT 129 (L) 01/31/2022  ? ? ?  Latest Ref Rng & Units 01/31/2022  ?  4:26  AM  ?CMP  ?Glucose 70 - 99 mg/dL 97    ?BUN 6 - 20 mg/dL 5    ?Creatinine 0.44 - 1.00 mg/dL 0.82    ?Sodium 135 - 145 mmol/L 139    ?Potassium 3.5 - 5.1 mmol/L 3.8    ?Chloride 98 - 111 mmol/L 113    ?CO2 22 - 32 mmol/L 18    ?Calcium 8.9 - 10.3 mg/dL 8.7    ?Total Protein 6.5 - 8.1 g/dL 5.1    ?Total Bilirubin 0.3 - 1.2 mg/dL 0.6    ?Alkaline Phos 38 - 126 U/L 82    ?AST 15 - 41 U/L 33    ?ALT 0 - 44 U/L 17    ? ? ?Discharge instruction: per After Visit Summary and "Baby and Me Booklet". ? ?After Visit Meds:  ?Allergies as of 02/01/2022   ?No Known Allergies ?  ? ?  ?Medication List  ?  ? ?STOP taking these medications   ? ?hydroxyprogesterone caproate 250 mg/mL Oil injection ?Commonly known as: MAKENA ?  ?valACYclovir 500 MG tablet ?Commonly known as: VALTREX ?  ? ?  ? ?TAKE these medications   ? ?cholecalciferol 25 MCG (1000 UNIT) tablet ?Commonly known as: VITAMIN D3 ?Take 1,000 Units by mouth daily. ?  ?gabapentin 100 MG capsule ?Commonly known as: NEURONTIN ?Take 1 capsule (100 mg total) by mouth every 8 (eight) hours as needed (burning incision pain). ?  ?ibuprofen 600 MG  tablet ?Commonly known as: ADVIL ?Take 1 tablet (600 mg total) by mouth every 6 (six) hours as needed for moderate pain or cramping. ?  ?multivitamin-prenatal 27-0.8 MG Tabs tablet ?Take 1 tablet by mouth daily at 12 noon. ?  ?oxyCODONE-acetaminophen 5-325 MG tablet ?Commonly known as: PERCOCET/ROXICET ?Take 1-2 tablets by mouth every 4 (four) hours as needed for severe pain. ?  ? ?  ? ?  ?  ? ? ?  ?Discharge Care Instructions  ?(From admission, onward)  ?  ? ? ?  ? ?  Start     Ordered  ? 02/01/22 0000  Discharge wound care:       ?Comments: Top honeycomb dressing in 3 days then underlying steri strips in 10 days  ? 02/01/22 0955  ? ?  ?  ? ?  ? ? ?Diet: routine diet ? ?Activity: Advance as tolerated. Pelvic rest for 6 weeks.  ? ?Outpatient follow up:1 week ?Follow up Appt:No future appointments. ?Follow up visit: No follow-ups on file. ? ?Postpartum contraception: Not Discussed ? ?02/01/2022 ?Sanjuana Kava, MD ? ?  ?

## 2022-02-01 NOTE — Progress Notes (Signed)
MD Progress Note ? ?Miles Costain [phone]  ? ?Subjective: ?Patient w/o complaints. She notes she feels "sore" but pain is well controlled ?Feeding: breast and bottle ?Reports tolerating PO, no N/V, ambulating and voiding w/o difficulty  ?Denies chest pain HA/SOB/dizziness  ?Flatus yes ?Vaginal bleeding is normal, no clots  ? ?Objective: ? VS:  ?Vitals:  ? 01/31/22 1105 01/31/22 1658 01/31/22 1943 02/01/22 0757  ?BP: (!) 120/59 126/72 124/70 127/72  ?Pulse: 82 89 80 80  ?Resp: 18 19 18 18   ?Temp: 98 ?F (36.7 ?C) 98.2 ?F (36.8 ?C) 98 ?F (36.7 ?C) 98.3 ?F (36.8 ?C)  ?TempSrc: Oral Oral Oral Oral  ?SpO2: 99% 100% 100% 100%  ?Weight:      ?Height:      ? ? ?Intake/Output Summary (Last 24 hours) at 02/01/2022 0945 ?Last data filed at 02/01/2022 0500 ?Gross per 24 hour  ?Intake 3296.42 ml  ?Output 2500 ml  ?Net 796.42 ml  ?   ?Recent Labs  ?  01/30/22 ?2233 01/31/22 ?0426  ?WBC 11.7* 12.9*  ?HGB 11.5* 10.3*  ?HCT 34.2* 29.4*  ?PLT 141* 129*  ? ? ?Blood type: --/--/O POS (03/20 1753) ?Rubella: Immune (08/30 0000)  ?  ?Physical Exam:  ?General: alert, cooperative, and no distress ?CV: Regular rate and rhythm ?Resp: clear ?Abdomen: soft, nontender, normal bowel sounds ?Incision: clean, dry, and intact ?Uterine Fundus: firm, below umbilicus, nontender ?Lochia: none ?Ext: extremities normal, atraumatic, no cyanosis or edema and Homans sign is negative, no sign of DVT ? ? ?Assessment/Plan: ?36 y.o.   POD# 2. 30  ?                ?Patient meeting discharge criteria, will d/c home ?With incision check follow up in one week ? ?[id number], MD ?02/01/2022, 9:45 AM  ?

## 2022-02-02 ENCOUNTER — Telehealth: Payer: Self-pay

## 2022-02-02 NOTE — Telephone Encounter (Signed)
Transition Care Management Unsuccessful Follow-up Telephone Call ? ?Date of discharge and from where:  02/01/2022-[facility]  ? ?Attempts:  1st Attempt ? ?Reason for unsuccessful TCM follow-up call:  Left voice message ? ?  ?

## 2022-02-10 ENCOUNTER — Telehealth (HOSPITAL_COMMUNITY): Payer: Self-pay | Admitting: *Deleted

## 2022-02-10 DIAGNOSIS — Z419 Encounter for procedure for purposes other than remedying health state, unspecified: Secondary | ICD-10-CM | POA: Diagnosis not present

## 2022-02-10 NOTE — Telephone Encounter (Signed)
Attempted hospital discharge follow-up call. Left message for patient to return RN call. Deforest Hoyles, RN, 02/10/22, [phone] ?

## 2022-03-12 DIAGNOSIS — Z419 Encounter for procedure for purposes other than remedying health state, unspecified: Secondary | ICD-10-CM | POA: Diagnosis not present

## 2022-03-13 DIAGNOSIS — Z308 Encounter for other contraceptive management: Secondary | ICD-10-CM | POA: Diagnosis not present

## 2022-03-13 DIAGNOSIS — Z113 Encounter for screening for infections with a predominantly sexual mode of transmission: Secondary | ICD-10-CM | POA: Diagnosis not present

## 2022-03-29 DIAGNOSIS — Z30430 Encounter for insertion of intrauterine contraceptive device: Secondary | ICD-10-CM | POA: Diagnosis not present

## 2022-04-12 DIAGNOSIS — Z419 Encounter for procedure for purposes other than remedying health state, unspecified: Secondary | ICD-10-CM | POA: Diagnosis not present

## 2022-05-12 DIAGNOSIS — Z419 Encounter for procedure for purposes other than remedying health state, unspecified: Secondary | ICD-10-CM | POA: Diagnosis not present

## 2022-06-12 DIAGNOSIS — Z419 Encounter for procedure for purposes other than remedying health state, unspecified: Secondary | ICD-10-CM | POA: Diagnosis not present

## 2022-07-13 DIAGNOSIS — Z419 Encounter for procedure for purposes other than remedying health state, unspecified: Secondary | ICD-10-CM | POA: Diagnosis not present

## 2022-08-12 DIAGNOSIS — Z419 Encounter for procedure for purposes other than remedying health state, unspecified: Secondary | ICD-10-CM | POA: Diagnosis not present

## 2022-09-12 DIAGNOSIS — Z419 Encounter for procedure for purposes other than remedying health state, unspecified: Secondary | ICD-10-CM | POA: Diagnosis not present

## 2022-10-12 DIAGNOSIS — Z419 Encounter for procedure for purposes other than remedying health state, unspecified: Secondary | ICD-10-CM | POA: Diagnosis not present

## 2022-11-12 DIAGNOSIS — Z419 Encounter for procedure for purposes other than remedying health state, unspecified: Secondary | ICD-10-CM | POA: Diagnosis not present

## 2022-12-13 DIAGNOSIS — Z419 Encounter for procedure for purposes other than remedying health state, unspecified: Secondary | ICD-10-CM | POA: Diagnosis not present

## 2022-12-17 ENCOUNTER — Telehealth: Payer: Medicaid Other | Admitting: Family Medicine

## 2022-12-17 DIAGNOSIS — H6690 Otitis media, unspecified, unspecified ear: Secondary | ICD-10-CM

## 2022-12-17 MED ORDER — AMOXICILLIN-POT CLAVULANATE 875-125 MG PO TABS: 1.0000 | ORAL_TABLET | Freq: Two times a day (BID) | ORAL | 0 refills | Status: AC

## 2022-12-17 NOTE — Patient Instructions (Addendum)
Loma Sender, thank you for joining Perlie Mayo, NP for today's virtual visit.  While this provider is not your primary care provider (PCP), if your PCP is located in our provider database this encounter information will be shared with them immediately following your visit.   [place] account gives you access to today's visit and all your visits, tests, and labs performed at [facility] " click here if you don't have a [facility] account or go to [email or web address]  Consent: (Patient) Olivia Gordon provided verbal consent for this virtual visit at the beginning of the encounter.  Current Medications:  Current Outpatient Medications:    amoxicillin-clavulanate (AUGMENTIN) 875-125 MG tablet, Take 1 tablet by mouth 2 (two) times daily for 7 days., Disp: 14 tablet, Rfl: 0   cholecalciferol (VITAMIN D3) 25 MCG (1000 UNIT) tablet, Take 1,000 Units by mouth daily., Disp: , Rfl:    gabapentin (NEURONTIN) 100 MG capsule, Take 1 capsule (100 mg total) by mouth every 8 (eight) hours as needed (burning incision pain)., Disp: 30 capsule, Rfl: 2   ibuprofen (ADVIL) 600 MG tablet, Take 1 tablet (600 mg total) by mouth every 6 (six) hours as needed for moderate pain or cramping., Disp: 60 tablet, Rfl: 3   oxyCODONE-acetaminophen (PERCOCET/ROXICET) 5-325 MG tablet, Take 1-2 tablets by mouth every 4 (four) hours as needed for severe pain., Disp: 40 tablet, Rfl: 0   Prenatal Vit-Fe Fumarate-FA (MULTIVITAMIN-PRENATAL) 27-0.8 MG TABS tablet, Take 1 tablet by mouth daily at 12 noon., Disp: , Rfl:    Medications ordered in this encounter:  Meds ordered this encounter  Medications   amoxicillin-clavulanate (AUGMENTIN) 875-125 MG tablet    Sig: Take 1 tablet by mouth 2 (two) times daily for 7 days.    Dispense:  14 tablet    Refill:  0    Order Specific Question:   Supervising Provider    Answer:   Chase Picket [id number]     *If you need refills on other  medications prior to your next appointment, please contact your pharmacy*  Follow-Up: Call back or seek an in-person evaluation if the symptoms worsen or if the condition fails to improve as anticipated.  [place] [phone]  Other Instructions Otitis Media, Adult  Otitis media is a condition in which the middle ear is red and swollen (inflamed) and full of fluid. The middle ear is the part of the ear that contains bones for hearing as well as air that helps send sounds to the brain. The condition usually goes away on its own. What are the causes? This condition is caused by a blockage in the eustachian tube. This tube connects the middle ear to the back of the nose. It normally allows air into the middle ear. The blockage is caused by fluid or swelling. Problems that can cause blockage include: A cold or infection that affects the nose, mouth, or throat. Allergies. An irritant, such as tobacco smoke. Adenoids that have become large. The adenoids are soft tissue located in the back of the throat, behind the nose and the roof of the mouth. Growth or swelling in the upper part of the throat, just behind the nose (nasopharynx). Damage to the ear caused by a change in pressure. This is called barotrauma. What increases the risk? You are more likely to develop this condition if you: Smoke or are exposed to tobacco smoke. Have an opening in the roof of your mouth (cleft palate).  Have acid reflux. Have problems in your body's defense system (immune system). What are the signs or symptoms? Symptoms of this condition include: Ear pain. Fever. Problems with hearing. Being tired. Fluid leaking from the ear. Ringing in the ear. How is this treated? This condition can go away on its own within 3-5 days. But if the condition is caused by germs (bacteria) and does not go away on its own, or if it keeps coming back, your doctor may: Give you antibiotic medicines. Give you  medicines for pain. Follow these instructions at home: Take over-the-counter and prescription medicines only as told by your doctor. If you were prescribed an antibiotic medicine, take it as told by your doctor. Do not stop taking it even if you start to feel better. Keep all follow-up visits. Contact a doctor if: You have bleeding from your nose. There is a lump on your neck. You are not feeling better in 5 days. You feel worse instead of better. Get help right away if: You have pain that is not helped with medicine. You have swelling, redness, or pain around your ear. You get a stiff neck. You cannot move part of your face (paralysis). You notice that the bone behind your ear hurts when you touch it. You get a very bad headache. Summary Otitis media means that the middle ear is red, swollen, and full of fluid. This condition usually goes away on its own. If the problem does not go away, treatment may be needed. You may be given medicines to treat the infection or to treat your pain. If you were prescribed an antibiotic medicine, take it as told by your doctor. Do not stop taking it even if you start to feel better. Keep all follow-up visits. This information is not intended to replace advice given to you by your health care provider. Make sure you discuss any questions you have with your health care provider. Document Revised: 02/06/2021 Document Reviewed: 02/06/2021 Elsevier Patient Education  [place].    If you have been instructed to have an in-person evaluation today at a local Urgent Care facility, please use the link below. It will take you to a list of all of our available [facility], including address, phone number and hours of operation. Please do not delay care.  [facility]  If you or a family member do not have a primary care provider, use the link below to schedule a visit and establish care. When you choose a [facility] primary  care physician or advanced practice provider, you gain a long-term partner in health. Find a Primary Care Provider  Learn more about [facility]'s in-office and virtual care options: [facility]

## 2022-12-17 NOTE — Progress Notes (Signed)
Virtual Visit Consent   Olivia Gordon, you are scheduled for a virtual visit with a [facility] provider today. Just as with appointments in the office, your consent must be obtained to participate. Your consent will be active for this visit and any virtual visit you may have with one of our providers in the next 365 days. If you have a MyChart account, a copy of this consent can be sent to you electronically.  As this is a virtual visit, video technology does not allow for your provider to perform a traditional examination. This may limit your provider's ability to fully assess your condition. If your provider identifies any concerns that need to be evaluated in person or the need to arrange testing (such as labs, EKG, etc.), we will make arrangements to do so. Although advances in technology are sophisticated, we cannot ensure that it will always work on either your end or our end. If the connection with a video visit is poor, the visit may have to be switched to a telephone visit. With either a video or telephone visit, we are not always able to ensure that we have a secure connection.  By engaging in this virtual visit, you consent to the provision of healthcare and authorize for your insurance to be billed (if applicable) for the services provided during this visit. Depending on your insurance coverage, you may receive a charge related to this service.  I need to obtain your verbal consent now. Are you willing to proceed with your visit today? Olivia Gordon has provided verbal consent on 12/17/2022 for a virtual visit (video or telephone). Perlie Mayo, NP  Date: 12/17/2022 10:50 AM  Virtual Visit via Video Note   I, Perlie Mayo, connected with  Olivia Gordon  ([id number], [birth year]) on 12/17/22 at 10:45 AM EST by a video-enabled telemedicine application and verified that I am speaking with the correct person using two identifiers.  Location: Patient: Virtual Visit Location Patient:  Home Provider: Virtual Visit Location Provider: Home Office   I discussed the limitations of evaluation and management by telemedicine and the availability of in person appointments. The patient expressed understanding and agreed to proceed.    History of Present Illness: Olivia Gordon is a 37 y.o. who identifies as a female who was assigned female at birth, and is being seen today for ear pain bilateral. Recent COVID infection- 2 weeks before this.  HPI: Otalgia  There is pain in both (left more than right) ears. This is a new problem. The current episode started in the past 7 days. The problem occurs constantly. The problem has been gradually worsening. There has been no fever. The pain is at a severity of 8/10. The pain is moderate. Pertinent negatives include no abdominal pain, coughing, diarrhea, ear discharge, headaches, hearing loss, neck pain, rash, rhinorrhea, sore throat or vomiting. Associated symptoms comments: Muffled tunneled like sound, pulsating pain and discomfort, echo sounds. She has tried acetaminophen for the symptoms. The treatment provided mild relief.    Problems:  Patient Active Problem List   Diagnosis Date Noted   Post-dates pregnancy 01/29/2022   SVD (spontaneous vaginal delivery) 10/17/2019   Abnormal liver function tests 10/12/2019   Preterm labor 10/12/2019   Abscess of vulva 05/17/2019   Inguinal hernia 05/17/2019   History of syphilis 04/23/2019   Pregnant 04/21/2019   History of premature delivery 04/17/2019   Cholelithiasis with chronic cholecystitis 07/11/2017   History of pancreatitis 07/11/2017  Allergies: No Known Allergies Medications:  Current Outpatient Medications:    cholecalciferol (VITAMIN D3) 25 MCG (1000 UNIT) tablet, Take 1,000 Units by mouth daily., Disp: , Rfl:    gabapentin (NEURONTIN) 100 MG capsule, Take 1 capsule (100 mg total) by mouth every 8 (eight) hours as needed (burning incision pain)., Disp: 30 capsule, Rfl: 2    ibuprofen (ADVIL) 600 MG tablet, Take 1 tablet (600 mg total) by mouth every 6 (six) hours as needed for moderate pain or cramping., Disp: 60 tablet, Rfl: 3   oxyCODONE-acetaminophen (PERCOCET/ROXICET) 5-325 MG tablet, Take 1-2 tablets by mouth every 4 (four) hours as needed for severe pain., Disp: 40 tablet, Rfl: 0   Prenatal Vit-Fe Fumarate-FA (MULTIVITAMIN-PRENATAL) 27-0.8 MG TABS tablet, Take 1 tablet by mouth daily at 12 noon., Disp: , Rfl:   Observations/Objective: Patient is well-developed, well-nourished in no acute distress.  Resting comfortably  at home.  Head is normocephalic, atraumatic.  No labored breathing.  Speech is clear and coherent with logical content.  Patient is alert and oriented at baseline.    Assessment and Plan:  1. Ear infection  - amoxicillin-clavulanate (AUGMENTIN) 875-125 MG tablet; Take 1 tablet by mouth 2 (two) times daily for 7 days.  Dispense: 14 tablet; Refill: 0   -avoid putting anything in ears -take meds as ordered -flonase to help ETD -follow up if not improving  Patient acknowledged agreement and understanding of the plan.    Reviewed side effects, risks and benefits of medication.    Past Medical, Surgical, Social History, Allergies, and Medications have been Reviewed.      Follow Up Instructions: I discussed the assessment and treatment plan with the patient. The patient was provided an opportunity to ask questions and all were answered. The patient agreed with the plan and demonstrated an understanding of the instructions.  A copy of instructions were sent to the patient via MyChart unless otherwise noted below.     The patient was advised to call back or seek an in-person evaluation if the symptoms worsen or if the condition fails to improve as anticipated.  Time:  I spent 8 minutes with the patient via telehealth technology discussing the above problems/concerns.    Perlie Mayo, NP

## 2023-01-07 DIAGNOSIS — N751 Abscess of Bartholin's gland: Secondary | ICD-10-CM | POA: Diagnosis not present

## 2023-01-11 DIAGNOSIS — N898 Other specified noninflammatory disorders of vagina: Secondary | ICD-10-CM | POA: Diagnosis not present

## 2023-01-11 DIAGNOSIS — Z419 Encounter for procedure for purposes other than remedying health state, unspecified: Secondary | ICD-10-CM | POA: Diagnosis not present

## 2023-01-11 DIAGNOSIS — N751 Abscess of Bartholin's gland: Secondary | ICD-10-CM | POA: Diagnosis not present

## 2023-01-13 ENCOUNTER — Other Ambulatory Visit: Payer: Self-pay

## 2023-01-13 ENCOUNTER — Ambulatory Visit (HOSPITAL_COMMUNITY)
Admission: EM | Admit: 2023-01-13 | Discharge: 2023-01-13 | Disposition: A | Discharge status: Home / Self Care | Payer: Medicaid Other | Attending: Emergency Medicine | Admitting: Emergency Medicine

## 2023-01-13 ENCOUNTER — Encounter (HOSPITAL_COMMUNITY): Payer: Self-pay | Admitting: *Deleted

## 2023-01-13 DIAGNOSIS — Z1152 Encounter for screening for COVID-19: Secondary | ICD-10-CM | POA: Insufficient documentation

## 2023-01-13 DIAGNOSIS — R509 Fever, unspecified: Secondary | ICD-10-CM | POA: Diagnosis present

## 2023-01-13 DIAGNOSIS — Z8616 Personal history of COVID-19: Secondary | ICD-10-CM | POA: Diagnosis not present

## 2023-01-13 DIAGNOSIS — B349 Viral infection, unspecified: Secondary | ICD-10-CM

## 2023-01-13 MED ORDER — ONDANSETRON 4 MG PO TBDP
ORAL_TABLET | ORAL | Status: AC
  Filled 2023-01-13: qty 1

## 2023-01-13 MED ORDER — ONDANSETRON 4 MG PO TBDP: 4.0000 mg | ORAL_TABLET | Freq: Three times a day (TID) | ORAL | 0 refills | Status: AC | PRN

## 2023-01-13 MED ORDER — OSELTAMIVIR PHOSPHATE 75 MG PO CAPS: 75.0000 mg | ORAL_CAPSULE | Freq: Two times a day (BID) | ORAL | 0 refills | Status: AC

## 2023-01-13 MED ORDER — ONDANSETRON 4 MG PO TBDP
4.0000 mg | ORAL_TABLET | Freq: Once | ORAL | Status: AC
  Administered 2023-01-13: 4 mg via ORAL

## 2023-01-13 MED ORDER — CYCLOBENZAPRINE HCL 10 MG PO TABS: 10.0000 mg | ORAL_TABLET | Freq: Every day | ORAL | 0 refills | Status: AC

## 2023-01-13 NOTE — Discharge Instructions (Signed)
Your symptoms today are most likely being caused by a virus and should steadily improve in time it can take up to 7 to 10 days before you truly start to see a turnaround however things will get better  Today you are being treated prophylactically for the flu as we are unable to test you and you have been having fevers, take Tamiflu every morning and every evening for 5 days, this medicine reduces the amount of virus in your body which help to minimize your symptoms  COVID test is pending up to 24 hours, you will be notified of positive test results only, if positive he may stop use of Tamiflu, COVID is a virus and will improve with time and treatment is to minimize symptoms or use of supportive medicine, if positive you will need to quarantine for 5 days and may return to activity on Friday  You have been given a dose of Zofran in office to help minimize your nausea, you may take this medicine at home every 8 hours, allowed to dissolve underneath tongue and wait at least 30 minutes before eating or drinking  You may use muscle relaxer twice daily as needed for additional comfort, be mindful this may make you feel drowsy    You can take Tylenol and/or Ibuprofen as needed for fever reduction and pain relief.   For cough: honey 1/2 to 1 teaspoon (you can dilute the honey in water or another fluid).  You can also use guaifenesin and dextromethorphan for cough. You can use a humidifier for chest congestion and cough.  If you don't have a humidifier, you can sit in the bathroom with the hot shower running.      For sore throat: try warm salt water gargles, cepacol lozenges, throat spray, warm tea or water with lemon/honey, popsicles or ice, or OTC cold relief medicine for throat discomfort.   For congestion: take a daily anti-histamine like Zyrtec, Claritin, and a oral decongestant, such as pseudoephedrine.  You can also use Flonase 1-2 sprays in each nostril daily.   It is important to stay hydrated:  drink plenty of fluids (water, gatorade/powerade/pedialyte, juices, or teas) to keep your throat moisturized and help further relieve irritation/discomfort.

## 2023-01-13 NOTE — ED Provider Notes (Signed)
[place]    CSN: [id number] Arrival date & time: 01/13/23  1147      History   Chief Complaint Chief Complaint  Patient presents with   Fever   Chills   Generalized Body Aches    HPI Olivia Gordon is a 37 y.o. female.   Patient presents for evaluation of fever, chills, body aches, nausea, cough and bilateral eye discomfort present for 1 day.  Fever peaking at 103.  Body aches are worsened when laying down, pain can be felt with all movements such as turning over.  Cough is productive but denies shortness of breath or wheezing.  Has been experiencing nasal congestion and rhinorrhea since January or 2024 after having COVID.  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  Difficulty tolerating foods but has been tolerating fluids.  Has attempted use of Tylenol which has been effective and fever management.  Denies respiratory history.    Past Medical History:  Diagnosis Date   Abnormal liver enzymes    Abscess of vulva    Cholelithiasis    Chronic cholecystitis    Inguinal hernia    Pancreatitis    Syphilis     Patient Active Problem List   Diagnosis Date Noted   Post-dates pregnancy 01/29/2022   SVD (spontaneous vaginal delivery) 10/17/2019   Abnormal liver function tests 10/12/2019   Preterm labor 10/12/2019   Abscess of vulva 05/17/2019   Inguinal hernia 05/17/2019   History of syphilis 04/23/2019   Pregnant 04/21/2019   History of premature delivery 04/17/2019   Cholelithiasis with chronic cholecystitis 07/11/2017   History of pancreatitis 07/11/2017    Past Surgical History:  Procedure Laterality Date   CESAREAN SECTION N/A 01/30/2022   Procedure: CESAREAN SECTION;  Surgeon: Sanjuana Kava, MD;  Location: [facility];  Service: Obstetrics;  Laterality: N/A;   CHOLECYSTECTOMY N/A 07/18/2017   Procedure: LAPAROSCOPIC CHOLECYSTECTOMY WITH INTRAOPERATIVE CHOLANGIOGRAM;  Surgeon: Armandina Gemma, MD;  Location: [facility];  Service: General;  Laterality: N/A;    ERCP Left 07/19/2017   Procedure: ENDOSCOPIC RETROGRADE CHOLANGIOPANCREATOGRAPHY (ERCP);  Surgeon: Ronnette Juniper, MD;  Location: [facility];  Service: Gastroenterology;  Laterality: Left;    OB History     Gravida  3   Para  3   Term  1   Preterm  2   AB  0   Living  3      SAB  0   IAB  0   Ectopic  0   Multiple  0   Live Births  3            Home Medications    Prior to Admission medications   Medication Sig Start Date End Date Taking? Authorizing Provider  cholecalciferol (VITAMIN D3) 25 MCG (1000 UNIT) tablet Take 1,000 Units by mouth daily.    [provider]  gabapentin (NEURONTIN) 100 MG capsule Take 1 capsule (100 mg total) by mouth every 8 (eight) hours as needed (burning incision pain). 02/01/22   Sanjuana Kava, MD  ibuprofen (ADVIL) 600 MG tablet Take 1 tablet (600 mg total) by mouth every 6 (six) hours as needed for moderate pain or cramping. 02/01/22   Sanjuana Kava, MD  oxyCODONE-acetaminophen (PERCOCET/ROXICET) 5-325 MG tablet Take 1-2 tablets by mouth every 4 (four) hours as needed for severe pain. 02/01/22   Sanjuana Kava, MD  Prenatal Vit-Fe Fumarate-FA (MULTIVITAMIN-PRENATAL) 27-0.8 MG TABS tablet Take 1 tablet by mouth daily at 12 noon.    [provider]    Family History Family History  Problem Relation Age of Onset   Healthy Mother    Healthy Father     Social History Social History   Tobacco Use   Smoking status: Never   Smokeless tobacco: Never  Vaping Use   Vaping Use: Never used  Substance Use Topics   Alcohol use: Not Currently   Drug use: Yes     Allergies   Patient has no known allergies.   Review of Systems Review of Systems  Constitutional:  Positive for chills and fever. Negative for activity change, appetite change, diaphoresis, fatigue and unexpected weight change.  HENT:  Positive for congestion and rhinorrhea. Negative for dental problem, drooling, ear discharge, ear pain, facial swelling,  hearing loss, mouth sores, nosebleeds, postnasal drip, sinus pressure, sinus pain, sneezing, sore throat, tinnitus, trouble swallowing and voice change.   Respiratory:  Positive for cough. Negative for apnea, choking, chest tightness, shortness of breath, wheezing and stridor.   Gastrointestinal:  Positive for nausea. Negative for abdominal distention, abdominal pain, anal bleeding, blood in stool, constipation, diarrhea, rectal pain and vomiting.  Musculoskeletal:  Positive for myalgias. Negative for arthralgias, back pain, gait problem, joint swelling, neck pain and neck stiffness.     Physical Exam Triage Vital Signs ED Triage Vitals  Enc Vitals Group     BP 01/13/23 1259 116/78     Pulse Rate 01/13/23 1259 86     Resp 01/13/23 1259 18     Temp 01/13/23 1259 99.1 F (37.3 C)     Temp src --      SpO2 01/13/23 1259 96 %     Weight --      Height --      Head Circumference --      Peak Flow --      Pain Score 01/13/23 1256 9     Pain Loc --      Pain Edu? --      Excl. in [place]? --    No data found.  Updated Vital Signs BP 116/78   Pulse 86   Temp 99.1 F (37.3 C)   Resp 18   LMP 12/29/2022   SpO2 96%   Visual Acuity Right Eye Distance:   Left Eye Distance:   Bilateral Distance:    Right Eye Near:   Left Eye Near:    Bilateral Near:     Physical Exam Constitutional:      Appearance: She is ill-appearing.  HENT:     Head: Normocephalic.     Right Ear: Tympanic membrane, ear canal and external ear normal.     Left Ear: Tympanic membrane, ear canal and external ear normal.     Nose: Congestion and rhinorrhea present.     Mouth/Throat:     Mouth: Mucous membranes are moist.     Pharynx: Posterior oropharyngeal erythema present.     Tonsils: No tonsillar exudate. 0 on the right. 0 on the left.  Eyes:     Extraocular Movements: Extraocular movements intact.  Cardiovascular:     Rate and Rhythm: Normal rate and regular rhythm.     Pulses: Normal pulses.      Heart sounds: Normal heart sounds.  Pulmonary:     Effort: Pulmonary effort is normal.     Breath sounds: Normal breath sounds.  Musculoskeletal:     Cervical back: Normal range of motion and neck supple.  Skin:    General: Skin is warm and dry.  Neurological:     Mental Status: She is alert and oriented to person, place, and time. Mental status is at baseline.  Psychiatric:        Mood and Affect: Mood normal.        Behavior: Behavior normal.      UC Treatments / Results  Labs (all labs ordered are listed, but only abnormal results are displayed) Labs Reviewed - No data to display  EKG   Radiology No results found.  Procedures Procedures (including critical care time)  Medications Ordered in UC Medications - No data to display  Initial Impression / Assessment and Plan / UC Course  I have reviewed the triage vital signs and the nursing notes.  Pertinent labs & imaging results that were available during my care of the patient were reviewed by me and considered in my medical decision making (see chart for details).  Viral illness  Vital signs stable not ill-appearing patient is in no signs of distress, COVID test is pending, discussed quarantine guidelines, as a young healthy adult does not qualify for antivirals, treating prophylactically for influenza as we are unable to test since she is experiencing high fevers, Tamiflu prescribed, given ODT Zofran in office as patient has become very nauseous when changing positions, also sent to pharmacy, prescribed Flexeril as body aches at home as well as worrisome symptom, may use additional over-the-counter medications as needed for supportive care, work note given, may follow-up with his urgent care as needed if symptoms persist or worsen  Final Clinical Impressions(s) / UC Diagnoses   Final diagnoses:  None   Discharge Instructions   None    ED Prescriptions   None    PDMP not reviewed this encounter.   Hans Eden, [place] 01/13/23 1339

## 2023-01-13 NOTE — ED Triage Notes (Signed)
Pt reports fever,chills body aches that started last night.

## 2023-01-14 LAB — SARS CORONAVIRUS 2 (TAT 6-24 HRS): SARS Coronavirus 2: NEGATIVE

## 2023-02-11 DIAGNOSIS — Z419 Encounter for procedure for purposes other than remedying health state, unspecified: Secondary | ICD-10-CM | POA: Diagnosis not present

## 2023-03-13 DIAGNOSIS — Z419 Encounter for procedure for purposes other than remedying health state, unspecified: Secondary | ICD-10-CM | POA: Diagnosis not present

## 2023-04-13 DIAGNOSIS — Z419 Encounter for procedure for purposes other than remedying health state, unspecified: Secondary | ICD-10-CM | POA: Diagnosis not present

## 2023-05-11 IMAGING — CT CT ABD-PEL WO/W CM
3 of 16 series · 10 of 46 positions shown, 16 images · IV contrast (agent unspecified)
Comparison: 07/29/2017

CLINICAL DATA: Status post C-section with right uterine artery
ligation, evaluate ureters

EXAM:
CT ABDOMEN AND PELVIS WITHOUT AND WITH CONTRAST
TECHNIQUE: Multidetector CT imaging of the abdomen and pelvis was performed
following the standard protocol before and following the bolus
administration of intravenous contrast.

[Series 7: thins · axial · 0.90mm/px · z∈[+751,+1075]mm · 6 of 649 slices shown, 11 images (1 of 2)]
[im 93/649  soft-tissue]
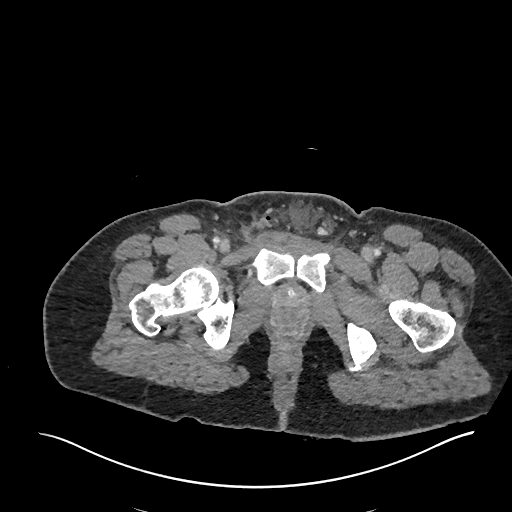
[im 93/649  bone]
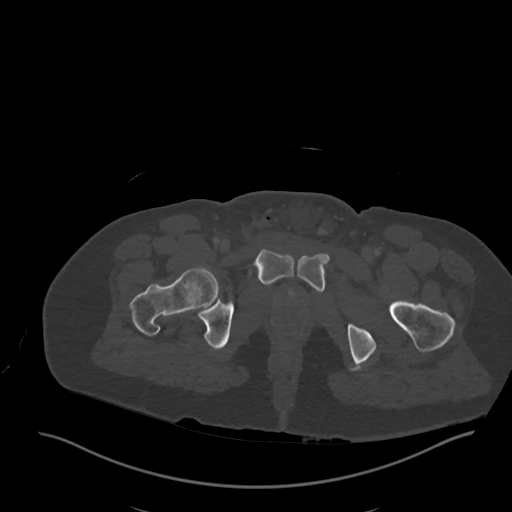
[im 186/649  soft-tissue]
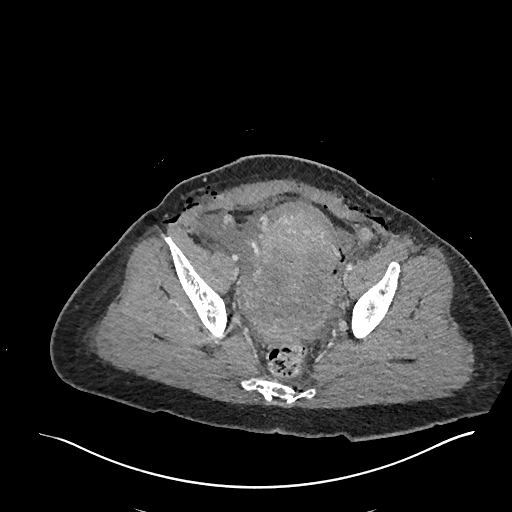
[im 278/649  soft-tissue]
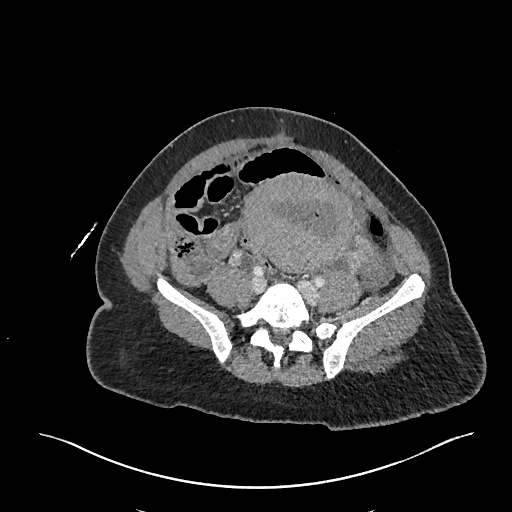
[im 278/649  lung]
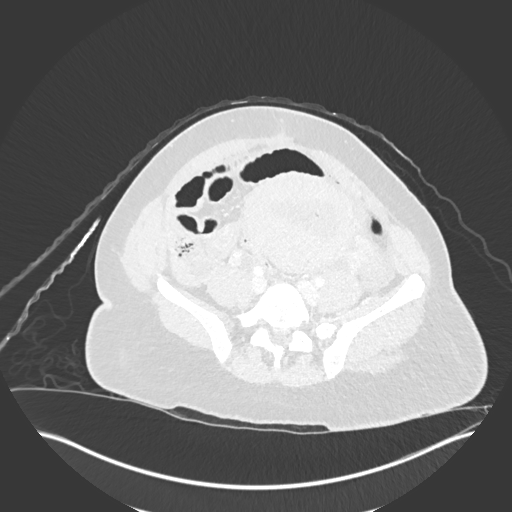
[im 371/649  soft-tissue]
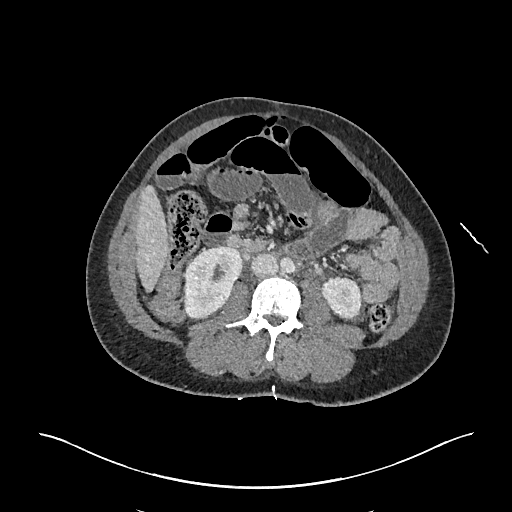
[im 371/649  lung]
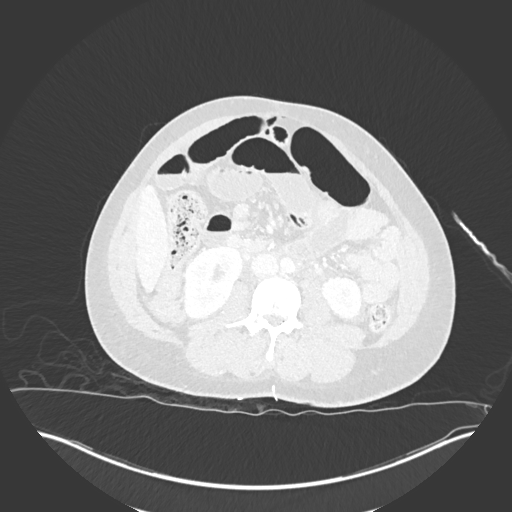
[im 463/649  soft-tissue]
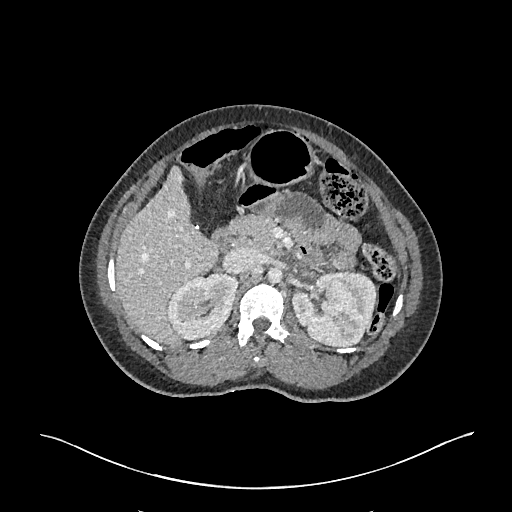
[im 463/649  lung]
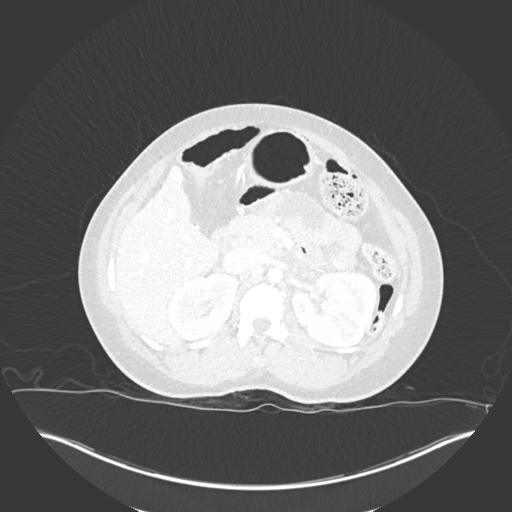
[im 556/649  soft-tissue]
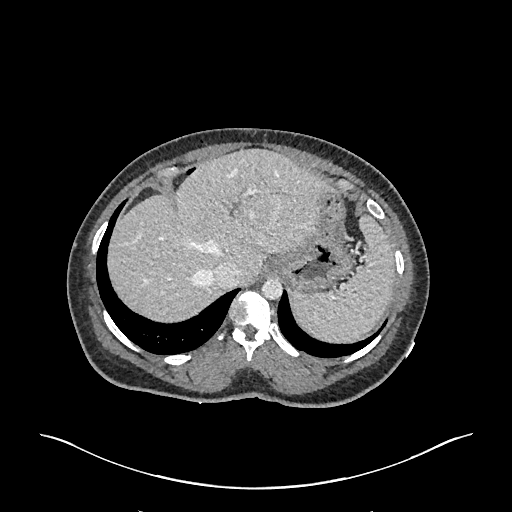
[im 556/649  lung]
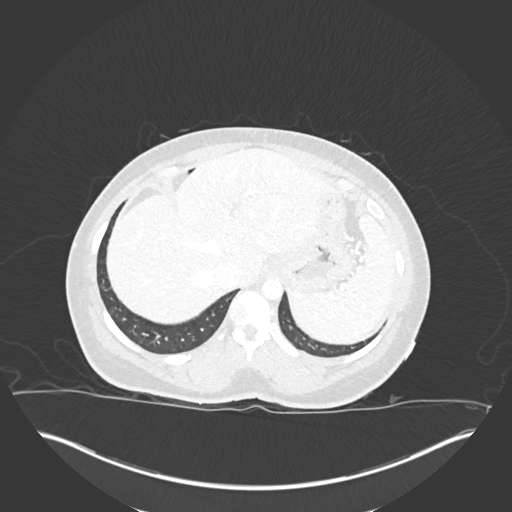

[Series 8: coronal · coronal · 0.86mm/px · 2 of 109 slices shown, 3 images]
[im 37/109  soft-tissue]
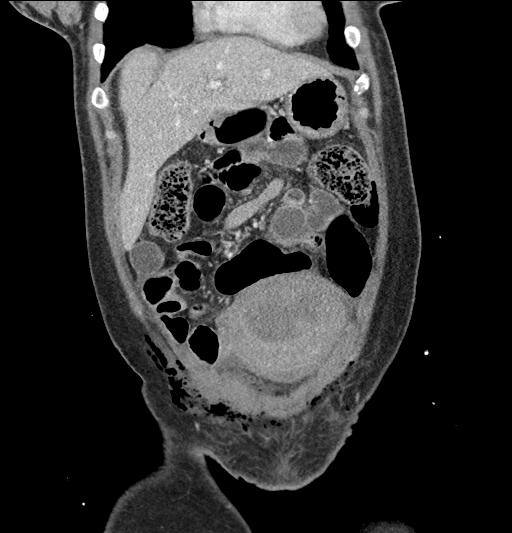
[im 37/109  bone]
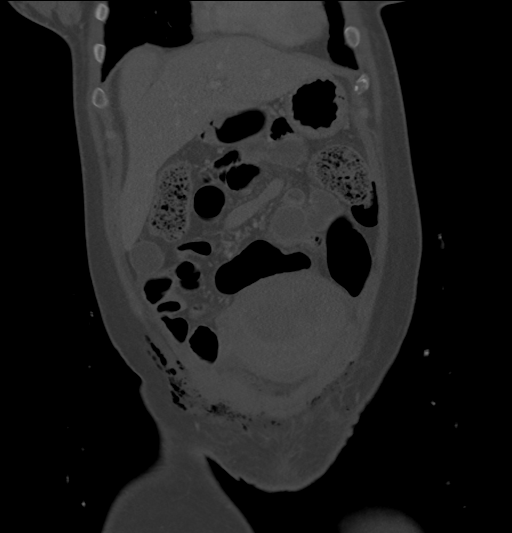
[im 73/109  soft-tissue]
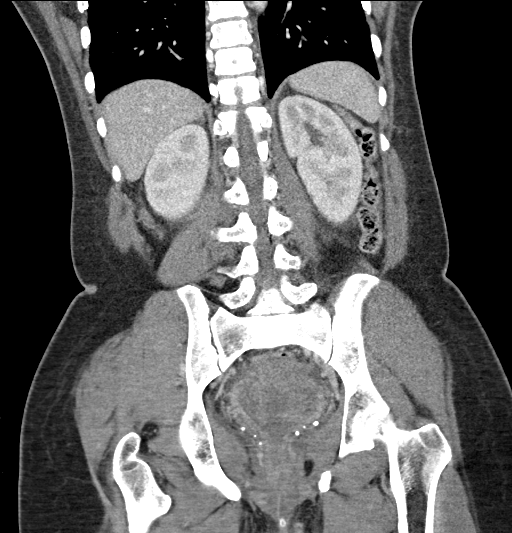

[Series 18: thins · axial · 0.55mm/px · z∈[+733,+798]mm · 2 of 649 slices shown (2 of 2)]
[im 93/649  soft-tissue]
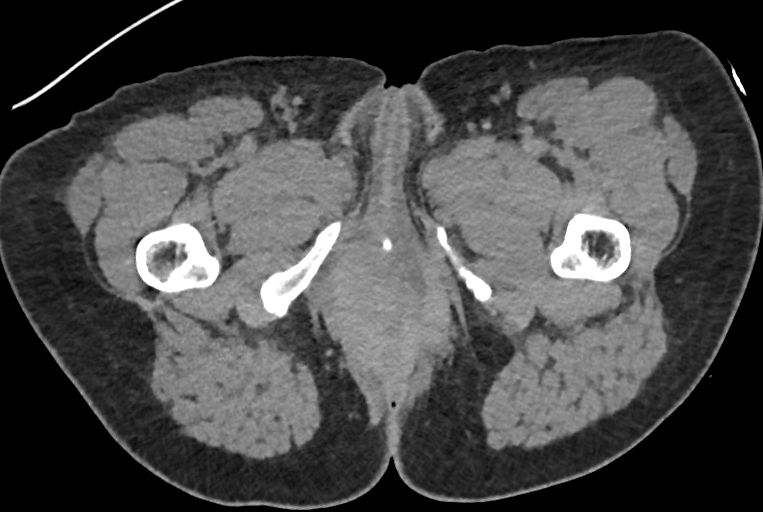
[im 186/649  soft-tissue]
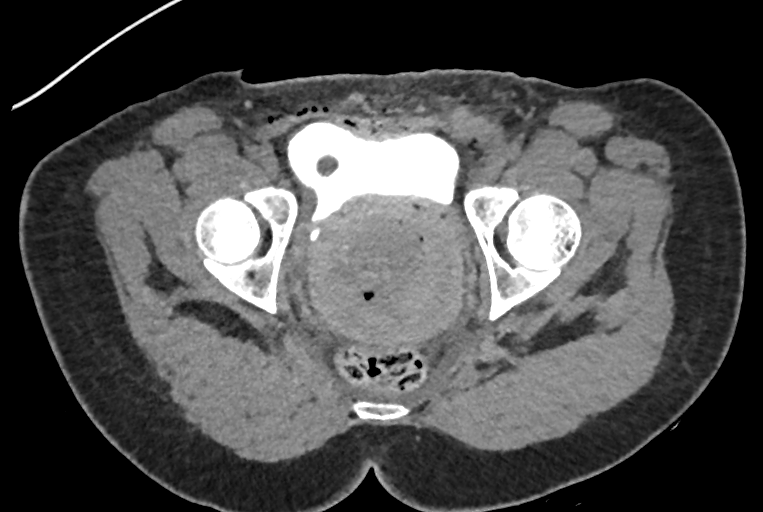

[10 of 46 positions shown; findings below may reference images not displayed]

RADIATION DOSE REDUCTION: This exam was performed according to the
departmental dose-optimization program which includes automated
exposure control, adjustment of the mA and/or kV according to
patient size and/or use of iterative reconstruction technique.

CONTRAST:  125mL OMNIPAQUE IOHEXOL 300 MG/ML  SOLN
FINDINGS: Lower chest: Trace left pleural effusion.

Hepatobiliary: Liver is within normal limits.

Status post cholecystectomy. No hepatic or extrahepatic ductal
dilatation.

Pancreas: Within normal limits.

Spleen: Within normal limits.

Adrenals/Urinary Tract: Adrenal glands are within normal limits.

Kidneys are within normal limits.  No hydronephrosis.

On delayed imaging, there are no filling defects in the bilateral
opacified proximal collecting systems, ureters, or bladder. No
evidence of ureteral injury or leak.

Bladder is notable for an indwelling Foley catheter less otherwise
within normal limits.

Stomach/Bowel: Stomach is within normal limits.

Multiple dilated loops of small bowel in the central abdomen, likely
reflecting adynamic small bowel ileus in this clinical setting.

Appendix is not discretely visualized.

No colonic wall thickening or inflammatory changes.

Vascular/Lymphatic: No evidence of abdominal aortic aneurysm.

No suspicious abdominopelvic lymphadenopathy.

Reproductive: Post gravid uterus with postsurgical changes along the
anterior lower uterine segment with endometrial debris and gas.
Overlying mild fluid/hemorrhage (series 5/image 68) without
associated drainable fluid collection/abscess.

Bilateral ovaries are within normal limits.

Other: Small volume abdominopelvic ascites.

Trace foci of free air (for example, series 5/image 14),
postsurgical.

Additional postsurgical changes along the lower anterior abdominal
wall (series 5/image 70).

Musculoskeletal: Mild degenerative changes at L5-S1.
IMPRESSION: Postsurgical changes related to low anterior C-section, as above.

No evidence of ureteral injury or leak.

Multiple dilated loops of small bowel in the central abdomen, likely
reflecting adynamic small bowel ileus in this clinical setting.

Trace left pleural effusion.

## 2023-05-13 DIAGNOSIS — Z419 Encounter for procedure for purposes other than remedying health state, unspecified: Secondary | ICD-10-CM | POA: Diagnosis not present

## 2023-06-13 DIAGNOSIS — Z419 Encounter for procedure for purposes other than remedying health state, unspecified: Secondary | ICD-10-CM | POA: Diagnosis not present

## 2023-07-14 DIAGNOSIS — Z419 Encounter for procedure for purposes other than remedying health state, unspecified: Secondary | ICD-10-CM | POA: Diagnosis not present

## 2023-08-13 DIAGNOSIS — Z419 Encounter for procedure for purposes other than remedying health state, unspecified: Secondary | ICD-10-CM | POA: Diagnosis not present

## 2023-09-13 DIAGNOSIS — Z419 Encounter for procedure for purposes other than remedying health state, unspecified: Secondary | ICD-10-CM | POA: Diagnosis not present

## 2023-10-13 DIAGNOSIS — Z419 Encounter for procedure for purposes other than remedying health state, unspecified: Secondary | ICD-10-CM | POA: Diagnosis not present

## 2023-11-13 DIAGNOSIS — Z419 Encounter for procedure for purposes other than remedying health state, unspecified: Secondary | ICD-10-CM | POA: Diagnosis not present

## 2023-12-08 ENCOUNTER — Telehealth: Payer: Medicaid Other | Admitting: Family Medicine

## 2023-12-08 DIAGNOSIS — J111 Influenza due to unidentified influenza virus with other respiratory manifestations: Secondary | ICD-10-CM | POA: Diagnosis not present

## 2023-12-08 MED ORDER — OSELTAMIVIR PHOSPHATE 75 MG PO CAPS: 75.0000 mg | ORAL_CAPSULE | Freq: Two times a day (BID) | ORAL | 0 refills | Status: AC

## 2023-12-08 NOTE — Progress Notes (Signed)

## 2023-12-14 DIAGNOSIS — Z419 Encounter for procedure for purposes other than remedying health state, unspecified: Secondary | ICD-10-CM | POA: Diagnosis not present

## 2024-01-11 DIAGNOSIS — Z419 Encounter for procedure for purposes other than remedying health state, unspecified: Secondary | ICD-10-CM | POA: Diagnosis not present

## 2024-02-22 DIAGNOSIS — Z419 Encounter for procedure for purposes other than remedying health state, unspecified: Secondary | ICD-10-CM | POA: Diagnosis not present

## 2024-03-23 DIAGNOSIS — Z419 Encounter for procedure for purposes other than remedying health state, unspecified: Secondary | ICD-10-CM | POA: Diagnosis not present

## 2024-04-23 DIAGNOSIS — Z419 Encounter for procedure for purposes other than remedying health state, unspecified: Secondary | ICD-10-CM | POA: Diagnosis not present

## 2024-05-23 DIAGNOSIS — Z419 Encounter for procedure for purposes other than remedying health state, unspecified: Secondary | ICD-10-CM | POA: Diagnosis not present

## 2024-06-23 DIAGNOSIS — Z419 Encounter for procedure for purposes other than remedying health state, unspecified: Secondary | ICD-10-CM | POA: Diagnosis not present

## 2024-07-24 DIAGNOSIS — Z419 Encounter for procedure for purposes other than remedying health state, unspecified: Secondary | ICD-10-CM | POA: Diagnosis not present

## 2024-08-23 DIAGNOSIS — Z419 Encounter for procedure for purposes other than remedying health state, unspecified: Secondary | ICD-10-CM | POA: Diagnosis not present

## 2024-10-28 ENCOUNTER — Telehealth: Payer: Self-pay | Admitting: Physician Assistant

## 2024-10-28 DIAGNOSIS — L0100 Impetigo, unspecified: Secondary | ICD-10-CM

## 2024-10-28 MED ORDER — MUPIROCIN 2 % EX OINT: 1.0000 | TOPICAL_OINTMENT | Freq: Two times a day (BID) | CUTANEOUS | 0 refills | Status: AC

## 2024-10-28 NOTE — Patient Instructions (Signed)
 Olivia Gordon, thank you for joining Olivia CHRISTELLA Dickinson, PA-C for today's virtual visit.  While this provider is not your primary care provider (PCP), if your PCP is located in our provider database this encounter information will be shared with them immediately following your visit.   A [facility] MyChart account gives you access to today's visit and all your visits, tests, and labs performed at [facility]  click here if you don't have a [facility] MyChart account or go to [email or web address]  Consent: (Patient) Olivia Gordon provided verbal consent for this virtual visit at the beginning of the encounter.  Current Medications:  Current Outpatient Medications:    mupirocin  ointment (BACTROBAN ) 2 %, Apply 1 Application topically 2 (two) times daily., Disp: 22 g, Rfl: 0   cholecalciferol (VITAMIN D3) 25 MCG (1000 UNIT) tablet, Take 1,000 Units by mouth daily., Disp: , Rfl:    cyclobenzaprine  (FLEXERIL ) 10 MG tablet, Take 1 tablet (10 mg total) by mouth at bedtime., Disp: 10 tablet, Rfl: 0   gabapentin  (NEURONTIN ) 100 MG capsule, Take 1 capsule (100 mg total) by mouth every 8 (eight) hours as needed (burning incision pain)., Disp: 30 capsule, Rfl: 2   ibuprofen  (ADVIL ) 600 MG tablet, Take 1 tablet (600 mg total) by mouth every 6 (six) hours as needed for moderate pain or cramping., Disp: 60 tablet, Rfl: 3   ondansetron  (ZOFRAN -ODT) 4 MG disintegrating tablet, Take 1 tablet (4 mg total) by mouth every 8 (eight) hours as needed for nausea or vomiting., Disp: 20 tablet, Rfl: 0   oseltamivir  (TAMIFLU ) 75 MG capsule, Take 1 capsule (75 mg total) by mouth every 12 (twelve) hours., Disp: 10 capsule, Rfl: 0   oxyCODONE -acetaminophen  (PERCOCET/ROXICET) 5-325 MG tablet, Take 1-2 tablets by mouth every 4 (four) hours as needed for severe pain., Disp: 40 tablet, Rfl: 0   Prenatal Vit-Fe Fumarate-FA (MULTIVITAMIN-PRENATAL) 27-0.8 MG TABS tablet, Take 1 tablet by mouth daily at 12  noon., Disp: , Rfl:    Medications ordered in this encounter:  Meds ordered this encounter  Medications   mupirocin  ointment (BACTROBAN ) 2 %    Sig: Apply 1 Application topically 2 (two) times daily.    Dispense:  22 g    Refill:  0    Supervising Provider:   BLAISE ALEENE KIDD [[id number]]     *If you need refills on other medications prior to your next appointment, please contact your pharmacy*  Follow-Up: Call back or seek an in-person evaluation if the symptoms worsen or if the condition fails to improve as anticipated.  [facility] [phone]  Other Instructions Skin Infection (Impetigo) in Adults: What to Know Impetigo is an infection of the skin. It commonly occurs in young children, but it can also occur in adults. There are two types of impetigo: Bullous. Nonbullous. Ecthyma is a skin problem that is like impetigo but more serious. It is usually caused by the same bacteria. Ecthyma causes deep sores on the skin that can leave scars. Sometimes, people call it deep impetigo. Impetigo usually goes away in 7-10 days with treatment. What are the causes? This condition is caused by two types of germs (bacteria). Nonbullous impetigo can be caused by staphylococci or streptococci. Bullous impetigo is caused by staphylococci. These bacteria cause impetigo when they get under the surface of the skin. Impetigo is contagious, which means it spreads easily from person to person. It may be spread through close skin contact or by sharing towels, clothing, or  other items that an infected person has touched. Scratching the affected area can cause impetigo to spread to other parts of the body. The bacteria can get under your fingernails and spread when you touch another area of your skin. What increases the risk? The following factors may make you more likely to get this condition: Playing sports that include skin-to-skin contact with others. Having broken skin, such as from a  cut, scrape, insect bite, or rash. Living in an area that has high humidity levels. Having poor hygiene. Having high levels of staphylococci in your nose. Having a condition that weakens the skin, such as: Having a weak body defense system (immune system). Having a skin condition with open sores, such as chickenpox. Having diabetes. What are the signs or symptoms? Impetigo causes itchy blisters and sores. Your symptoms will depend on the type of impetigo you have. In some cases, the blisters can cause itching or burning. Bullous impetigo You might get big blisters that break open and leak yellow fluid. These blisters usually appear on your belly, arms, legs, under your arms, or in your groin area. Nonbullous impetigo You might get small blisters that break open and leak, making a yellow crust. These blisters are often found on your face, arms, or legs. Ecthyma You might have deep sores that break open and leak, forming a black or brown crust. How is this diagnosed? Impetigo may be diagnosed with an exam. Your health care provider will look at the sores. In some cases, a swab of the fluid from the sores may be taken. How is this treated? Treatment for this condition depends on how bad your symptoms are Mild impetigo can be treated with prescription antibiotic cream. Oral antibiotic medicine may be used in really bad cases. Medicines that help with itching (antihistamines)may also be used. Follow these instructions at home: Medicines Take medicines only as told. If you were given antibiotic cream or ointment, use it for the time you were told. Do not stop using it sooner even if you start to feel better. Before applying antibiotic cream or ointment, you should: Gently wash the infected areas with antibacterial soap and warm water . Soak crusted areas in warm, soapy water  using antibacterial soap. Gently rub the areas to remove crusts. Do not scrub. Preventing the spread of  infection  To help prevent impetigo from spreading to other body areas: Keep your fingernails short and clean. Do not scratch the blisters or sores. Cover infected areas, if necessary, to keep from scratching. Wash your hands often with soap and warm water  for at least 20 seconds. To help prevent impetigo from spreading to other people: Do not share towels. Wash your clothing and bedsheets in water  that is 140F (60C) or warmer. Stay home until you have used an antibiotic cream for 48 hours (2 days) or an oral antibiotic medicine for 24 hours (1 day). You should only return to work and activities with other people if your skin shows significant improvement. You may return to contact sports after you have used antibiotic medicine for 72 hours (3 days). Contact a health care provider if: You get more blisters or sores, even with treatment. Your skin sores don't get better after 72 hours (3 days) of treatment. You have a fever. The area around your sores becomes warm, red, or tender to the touch. Get help right away if: You have dark, reddish-brown pee. You do not pee often or you pee small amounts. You have swelling in your face, hands,  or feet. This information is not intended to replace advice given to you by your health care provider. Make sure you discuss any questions you have with your health care provider. Document Revised: 10/31/2023 Document Reviewed: 10/31/2023 Elsevier Patient Education  [employer].   If you have been instructed to have an in-person evaluation today at a local Urgent Care facility, please use the link below. It will take you to a list of all of our available [facility], including address, phone number and hours of operation. Please do not delay care.  [facility]  If you or a family member do not have a primary care provider, use the link below to schedule a visit and establish care. When you choose a [facility] primary care  physician or advanced practice provider, you gain a long-term partner in health. Find a Primary Care Provider  Learn more about 's in-office and virtual care options:  - Get Care Now

## 2024-10-28 NOTE — Progress Notes (Signed)
 Virtual Visit Consent   Olivia Gordon, you are scheduled for a virtual visit with a [facility] provider today. Just as with appointments in the office, your consent must be obtained to participate. Your consent will be active for this visit and any virtual visit you may have with one of our providers in the next 365 days. If you have a MyChart account, a copy of this consent can be sent to you electronically.  As this is a virtual visit, video technology does not allow for your provider to perform a traditional examination. This may limit your provider's ability to fully assess your condition. If your provider identifies any concerns that need to be evaluated in person or the need to arrange testing (such as labs, EKG, etc.), we will make arrangements to do so. Although advances in technology are sophisticated, we cannot ensure that it will always work on either your end or our end. If the connection with a video visit is poor, the visit may have to be switched to a telephone visit. With either a video or telephone visit, we are not always able to ensure that we have a secure connection.  By engaging in this virtual visit, you consent to the provision of healthcare and authorize for your insurance to be billed (if applicable) for the services provided during this visit. Depending on your insurance coverage, you may receive a charge related to this service.  I need to obtain your verbal consent now. Are you willing to proceed with your visit today? Olivia Gordon has provided verbal consent on 10/28/2024 for a virtual visit (video or telephone). Delon CHRISTELLA Dickinson, PA-C  Date: 10/28/2024 6:26 PM   Virtual Visit via Video Note   I, Delon CHRISTELLA Dickinson, connected with  Olivia Gordon  ([id number], [birth year]) on 10/28/2024 at  6:15 PM EST by a video-enabled telemedicine application and verified that I am speaking with the correct person using two identifiers.  Location: Patient: Virtual Visit  Location Patient: Home Provider: Virtual Visit Location Provider: Home Office   I discussed the limitations of evaluation and management by telemedicine and the availability of in person appointments. The patient expressed understanding and agreed to proceed.    History of Present Illness: Olivia Gordon is a 38 y.o. who identifies as a female who was assigned female at birth, and is being seen today for sores in nose. Does have a history of Impetigo. Symptoms have been present for 2 weeks. Having crusting and drainage. Crusting has been honey colored.  Symptoms started after a URI.  Does work in childcare.  Problems:  Patient Active Problem List   Diagnosis Date Noted   Post-dates pregnancy 01/29/2022   SVD (spontaneous vaginal delivery) 10/17/2019   Abnormal liver function tests 10/12/2019   Preterm labor 10/12/2019   Abscess of vulva 05/17/2019   Inguinal hernia 05/17/2019   History of syphilis 04/23/2019   Pregnant 04/21/2019   History of premature delivery 04/17/2019   Cholelithiasis with chronic cholecystitis 07/11/2017   History of pancreatitis 07/11/2017    Allergies: Allergies[1] Medications: Current Medications[2]  Observations/Objective: Patient is well-developed, well-nourished in no acute distress.  Resting comfortably at home.  Head is normocephalic, atraumatic.  No labored breathing.  Speech is clear and coherent with logical content.  Patient is alert and oriented at baseline.    Assessment and Plan: 1. Impetigo (Primary) - mupirocin  ointment (BACTROBAN ) 2 %; Apply 1 Application topically 2 (two) times daily.  Dispense: 22 g; Refill: 0  -  Suspect Impetigo - No concerning features for more involved cellulitis requiring systemic antibiotics at this time - Mupirocin  ointment prescribed - Keep skin clean and dry - Good hand hygiene - Avoid picking or scratching face - Seek in person evaluation if worsening or fails to improve  Follow Up Instructions: I  discussed the assessment and treatment plan with the patient. The patient was provided an opportunity to ask questions and all were answered. The patient agreed with the plan and demonstrated an understanding of the instructions.  A copy of instructions were sent to the patient via MyChart unless otherwise noted below.    The patient was advised to call back or seek an in-person evaluation if the symptoms worsen or if the condition fails to improve as anticipated.    Delon HERO Jaque Dacy, PA-C     [1] No Known Allergies [2]  Current Outpatient Medications:    mupirocin  ointment (BACTROBAN ) 2 %, Apply 1 Application topically 2 (two) times daily., Disp: 22 g, Rfl: 0   cholecalciferol (VITAMIN D3) 25 MCG (1000 UNIT) tablet, Take 1,000 Units by mouth daily., Disp: , Rfl:    cyclobenzaprine  (FLEXERIL ) 10 MG tablet, Take 1 tablet (10 mg total) by mouth at bedtime., Disp: 10 tablet, Rfl: 0   gabapentin  (NEURONTIN ) 100 MG capsule, Take 1 capsule (100 mg total) by mouth every 8 (eight) hours as needed (burning incision pain)., Disp: 30 capsule, Rfl: 2   ibuprofen  (ADVIL ) 600 MG tablet, Take 1 tablet (600 mg total) by mouth every 6 (six) hours as needed for moderate pain or cramping., Disp: 60 tablet, Rfl: 3   ondansetron  (ZOFRAN -ODT) 4 MG disintegrating tablet, Take 1 tablet (4 mg total) by mouth every 8 (eight) hours as needed for nausea or vomiting., Disp: 20 tablet, Rfl: 0   oseltamivir  (TAMIFLU ) 75 MG capsule, Take 1 capsule (75 mg total) by mouth every 12 (twelve) hours., Disp: 10 capsule, Rfl: 0   oxyCODONE -acetaminophen  (PERCOCET/ROXICET) 5-325 MG tablet, Take 1-2 tablets by mouth every 4 (four) hours as needed for severe pain., Disp: 40 tablet, Rfl: 0   Prenatal Vit-Fe Fumarate-FA (MULTIVITAMIN-PRENATAL) 27-0.8 MG TABS tablet, Take 1 tablet by mouth daily at 12 noon., Disp: , Rfl:
